# Patient Record
Sex: Female | Born: 1950 | Race: Black or African American | Hispanic: No | Marital: Married | Smoking: Current every day smoker
Health system: Southern US, Community
[De-identification: ages and names within clinical notes are randomized; demographics above are authoritative.]

## PROBLEM LIST (undated history)

## (undated) DIAGNOSIS — J449 Chronic obstructive pulmonary disease, unspecified: Secondary | ICD-10-CM

## (undated) DIAGNOSIS — I10 Essential (primary) hypertension: Secondary | ICD-10-CM

## (undated) HISTORY — PX: APPENDECTOMY: SHX54

## (undated) HISTORY — PX: EXPLORATORY LAPAROTOMY: SUR591

---

## 2010-07-13 ENCOUNTER — Emergency Department (HOSPITAL_COMMUNITY): Admission: EM | Admit: 2010-07-13 | Discharge: 2010-07-13 | Payer: Self-pay | Admitting: Emergency Medicine

## 2011-01-16 LAB — POCT CARDIAC MARKERS
CKMB, poc: <1 ng/mL — ABNORMAL LOW (ref 1.0–8.0)
Myoglobin, poc: 96.1 ng/mL (ref 12–200)
Troponin i, poc: <0.05 ng/mL (ref 0.00–0.09)

## 2011-01-16 LAB — CBC
HCT: 40.8 % (ref 36.0–46.0)
Hemoglobin: 13.5 g/dL (ref 12.0–15.0)
MCHC: 33 g/dL (ref 30.0–36.0)
RBC: 4.62 MIL/uL (ref 3.87–5.11)
WBC: 11.4 10*3/uL — ABNORMAL HIGH (ref 4.0–10.5)

## 2011-01-16 LAB — DIFFERENTIAL
Basophils Absolute: 0 10*3/uL (ref 0.0–0.1)
Basophils Relative: 0 % (ref 0–1)
Lymphocytes Relative: 12 % (ref 12–46)
Monocytes Absolute: 0.5 10*3/uL (ref 0.1–1.0)
Monocytes Relative: 5 % (ref 3–12)
Neutro Abs: 9.3 10*3/uL — ABNORMAL HIGH (ref 1.7–7.7)
Neutrophils Relative %: 82 % — ABNORMAL HIGH (ref 43–77)

## 2011-01-16 LAB — BASIC METABOLIC PANEL
Calcium: 8.7 mg/dL (ref 8.4–10.5)
GFR calc Af Amer: >60 mL/min (ref >60)
GFR calc non Af Amer: >60 mL/min (ref >60)
Potassium: 3.6 mEq/L (ref 3.5–5.1)
Sodium: 137 mEq/L (ref 135–145)

## 2022-03-02 ENCOUNTER — Other Ambulatory Visit: Payer: Self-pay

## 2022-03-02 ENCOUNTER — Encounter (HOSPITAL_COMMUNITY): Payer: Self-pay | Admitting: Emergency Medicine

## 2022-03-02 ENCOUNTER — Emergency Department (HOSPITAL_COMMUNITY): Payer: 59

## 2022-03-02 ENCOUNTER — Observation Stay (HOSPITAL_COMMUNITY)
Admission: EM | Admit: 2022-03-02 | Discharge: 2022-03-04 | Disposition: A | Discharge status: Home / Self Care | Payer: 59 | Attending: Internal Medicine | Admitting: Internal Medicine

## 2022-03-02 DIAGNOSIS — K838 Other specified diseases of biliary tract: Secondary | ICD-10-CM | POA: Insufficient documentation

## 2022-03-02 DIAGNOSIS — K8050 Calculus of bile duct without cholangitis or cholecystitis without obstruction: Secondary | ICD-10-CM

## 2022-03-02 DIAGNOSIS — F17210 Nicotine dependence, cigarettes, uncomplicated: Secondary | ICD-10-CM | POA: Diagnosis not present

## 2022-03-02 DIAGNOSIS — K8020 Calculus of gallbladder without cholecystitis without obstruction: Secondary | ICD-10-CM | POA: Diagnosis not present

## 2022-03-02 DIAGNOSIS — Z79899 Other long term (current) drug therapy: Secondary | ICD-10-CM | POA: Insufficient documentation

## 2022-03-02 DIAGNOSIS — J449 Chronic obstructive pulmonary disease, unspecified: Secondary | ICD-10-CM | POA: Diagnosis not present

## 2022-03-02 DIAGNOSIS — Z7982 Long term (current) use of aspirin: Secondary | ICD-10-CM | POA: Insufficient documentation

## 2022-03-02 DIAGNOSIS — K219 Gastro-esophageal reflux disease without esophagitis: Secondary | ICD-10-CM

## 2022-03-02 DIAGNOSIS — I10 Essential (primary) hypertension: Secondary | ICD-10-CM | POA: Insufficient documentation

## 2022-03-02 DIAGNOSIS — K8010 Calculus of gallbladder with chronic cholecystitis without obstruction: Secondary | ICD-10-CM | POA: Diagnosis not present

## 2022-03-02 DIAGNOSIS — R1011 Right upper quadrant pain: Secondary | ICD-10-CM | POA: Diagnosis present

## 2022-03-02 DIAGNOSIS — R7401 Elevation of levels of liver transaminase levels: Secondary | ICD-10-CM | POA: Insufficient documentation

## 2022-03-02 HISTORY — DX: Chronic obstructive pulmonary disease, unspecified: J44.9

## 2022-03-02 HISTORY — DX: Essential (primary) hypertension: I10

## 2022-03-02 LAB — COMPREHENSIVE METABOLIC PANEL
ALT: 204 U/L — ABNORMAL HIGH (ref 0–44)
ALT: 150 U/L — ABNORMAL HIGH (ref 0–44)
AST: 404 U/L — ABNORMAL HIGH (ref 15–41)
AST: 211 U/L — ABNORMAL HIGH (ref 15–41)
Albumin: 5 g/dL (ref 3.5–5.0)
Albumin: 3.8 g/dL (ref 3.5–5.0)
Alkaline Phosphatase: 170 U/L — ABNORMAL HIGH (ref 38–126)
Alkaline Phosphatase: 130 U/L — ABNORMAL HIGH (ref 38–126)
Anion gap: 9 (ref 5–15)
Anion gap: 3 — ABNORMAL LOW (ref 5–15)
BUN: 13 mg/dL (ref 8–23)
BUN: 11 mg/dL (ref 8–23)
CO2: 26 mmol/L (ref 22–32)
CO2: 29 mmol/L (ref 22–32)
Calcium: 9.2 mg/dL (ref 8.9–10.3)
Calcium: 8.5 mg/dL — ABNORMAL LOW (ref 8.9–10.3)
Chloride: 99 mmol/L (ref 98–111)
Chloride: 108 mmol/L (ref 98–111)
Creatinine, Ser: 0.75 mg/dL (ref 0.44–1.00)
Creatinine, Ser: 0.77 mg/dL (ref 0.44–1.00)
GFR, Estimated: >60 mL/min (ref >60)
GFR, Estimated: >60 mL/min (ref >60)
Glucose, Bld: 117 mg/dL — ABNORMAL HIGH (ref 70–99)
Glucose, Bld: 111 mg/dL — ABNORMAL HIGH (ref 70–99)
Potassium: 3.5 mmol/L (ref 3.5–5.1)
Potassium: 4.4 mmol/L (ref 3.5–5.1)
Sodium: 134 mmol/L — ABNORMAL LOW (ref 135–145)
Sodium: 140 mmol/L (ref 135–145)
Total Bilirubin: 0.9 mg/dL (ref 0.3–1.2)
Total Bilirubin: 0.6 mg/dL (ref 0.3–1.2)
Total Protein: 8.9 g/dL — ABNORMAL HIGH (ref 6.5–8.1)
Total Protein: 6.7 g/dL (ref 6.5–8.1)

## 2022-03-02 LAB — CBC WITH DIFFERENTIAL/PLATELET
Abs Immature Granulocytes: 0.01 10*3/uL (ref 0.00–0.07)
Abs Immature Granulocytes: 0 10*3/uL (ref 0.00–0.07)
Basophils Absolute: 0.1 10*3/uL (ref 0.0–0.1)
Basophils Absolute: 0.1 10*3/uL (ref 0.0–0.1)
Basophils Relative: 1 %
Basophils Relative: 1 %
Eosinophils Absolute: 0.1 10*3/uL (ref 0.0–0.5)
Eosinophils Absolute: 0.1 10*3/uL (ref 0.0–0.5)
Eosinophils Relative: 1 %
Eosinophils Relative: 1 %
HCT: 45.8 % (ref 36.0–46.0)
HCT: 40 % (ref 36.0–46.0)
Hemoglobin: 14.9 g/dL (ref 12.0–15.0)
Hemoglobin: 12.3 g/dL (ref 12.0–15.0)
Immature Granulocytes: 0 %
Immature Granulocytes: 0 %
Lymphocytes Relative: 11 %
Lymphocytes Relative: 21 %
Lymphs Abs: 0.8 10*3/uL (ref 0.7–4.0)
Lymphs Abs: 1 10*3/uL (ref 0.7–4.0)
MCH: 28.2 pg (ref 26.0–34.0)
MCH: 27.2 pg (ref 26.0–34.0)
MCHC: 32.5 g/dL (ref 30.0–36.0)
MCHC: 30.8 g/dL (ref 30.0–36.0)
MCV: 86.7 fL (ref 80.0–100.0)
MCV: 88.3 fL (ref 80.0–100.0)
Monocytes Absolute: 0.5 10*3/uL (ref 0.1–1.0)
Monocytes Absolute: 0.4 10*3/uL (ref 0.1–1.0)
Monocytes Relative: 7 %
Monocytes Relative: 9 %
Neutro Abs: 5.9 10*3/uL (ref 1.7–7.7)
Neutro Abs: 3.3 10*3/uL (ref 1.7–7.7)
Neutrophils Relative %: 80 %
Neutrophils Relative %: 68 %
Platelets: 232 10*3/uL (ref 150–400)
Platelets: 209 10*3/uL (ref 150–400)
RBC: 5.28 MIL/uL — ABNORMAL HIGH (ref 3.87–5.11)
RBC: 4.53 MIL/uL (ref 3.87–5.11)
RDW: 14 % (ref 11.5–15.5)
RDW: 14 % (ref 11.5–15.5)
WBC: 7.4 10*3/uL (ref 4.0–10.5)
WBC: 4.8 10*3/uL (ref 4.0–10.5)
nRBC: 0 % (ref 0.0–0.2)
nRBC: 0 % (ref 0.0–0.2)

## 2022-03-02 LAB — HEPATIC FUNCTION PANEL
ALT: 117 U/L — ABNORMAL HIGH (ref 0–44)
AST: 92 U/L — ABNORMAL HIGH (ref 15–41)
Albumin: 3.9 g/dL (ref 3.5–5.0)
Alkaline Phosphatase: 123 U/L (ref 38–126)
Bilirubin, Direct: 0.2 mg/dL (ref 0.0–0.2)
Indirect Bilirubin: 0.3 mg/dL (ref 0.3–0.9)
Total Bilirubin: 0.5 mg/dL (ref 0.3–1.2)
Total Protein: 6.7 g/dL (ref 6.5–8.1)

## 2022-03-02 LAB — MAGNESIUM: Magnesium: 2.1 mg/dL (ref 1.7–2.4)

## 2022-03-02 LAB — LIPASE, BLOOD: Lipase: 32 U/L (ref 11–51)

## 2022-03-02 MED ORDER — ACETAMINOPHEN 650 MG RE SUPP: 650.0000 mg | Freq: Four times a day (QID) | RECTAL | Status: DC | PRN

## 2022-03-02 MED ORDER — AMLODIPINE BESYLATE 5 MG PO TABS
5.0000 mg | ORAL_TABLET | Freq: Every day | ORAL | Status: DC
  Administered 2022-03-02 – 2022-03-04 (×2): 5 mg via ORAL
  Filled 2022-03-02 (×3): qty 1

## 2022-03-02 MED ORDER — PIPERACILLIN-TAZOBACTAM 3.375 G IVPB
3.3750 g | Freq: Three times a day (TID) | INTRAVENOUS | Status: DC
  Administered 2022-03-02 – 2022-03-03 (×4): 3.375 g via INTRAVENOUS
  Filled 2022-03-02 (×4): qty 50

## 2022-03-02 MED ORDER — OXYCODONE HCL 5 MG PO TABS
5.0000 mg | ORAL_TABLET | ORAL | Status: DC | PRN
  Administered 2022-03-03 – 2022-03-04 (×3): 5 mg via ORAL
  Filled 2022-03-02 (×3): qty 1

## 2022-03-02 MED ORDER — SODIUM CHLORIDE 0.9 % IV SOLN: INTRAVENOUS | Status: DC

## 2022-03-02 MED ORDER — MORPHINE SULFATE (PF) 2 MG/ML IV SOLN: 2.0000 mg | INTRAVENOUS | Status: DC | PRN

## 2022-03-02 MED ORDER — ONDANSETRON HCL 4 MG PO TABS: 4.0000 mg | ORAL_TABLET | Freq: Four times a day (QID) | ORAL | Status: DC | PRN

## 2022-03-02 MED ORDER — MORPHINE SULFATE (PF) 4 MG/ML IV SOLN
4.0000 mg | Freq: Once | INTRAVENOUS | Status: AC
  Administered 2022-03-02: 4 mg via INTRAVENOUS
  Filled 2022-03-02: qty 1

## 2022-03-02 MED ORDER — SODIUM CHLORIDE 0.9 % IV BOLUS
1000.0000 mL | Freq: Once | INTRAVENOUS | Status: AC
  Administered 2022-03-02: 1000 mL via INTRAVENOUS

## 2022-03-02 MED ORDER — ONDANSETRON HCL 4 MG/2ML IJ SOLN
4.0000 mg | Freq: Four times a day (QID) | INTRAMUSCULAR | Status: DC | PRN
  Administered 2022-03-03: 4 mg via INTRAVENOUS
  Filled 2022-03-02: qty 2

## 2022-03-02 MED ORDER — ALBUTEROL SULFATE HFA 108 (90 BASE) MCG/ACT IN AERS
1.0000 | INHALATION_SPRAY | RESPIRATORY_TRACT | Status: DC | PRN
  Administered 2022-03-02 – 2022-03-03 (×2): 1 via RESPIRATORY_TRACT
  Filled 2022-03-02: qty 6.7

## 2022-03-02 MED ORDER — ONDANSETRON HCL 4 MG/2ML IJ SOLN
4.0000 mg | Freq: Once | INTRAMUSCULAR | Status: AC
  Administered 2022-03-02: 4 mg via INTRAVENOUS
  Filled 2022-03-02: qty 2

## 2022-03-02 MED ORDER — HEPARIN SODIUM (PORCINE) 5000 UNIT/ML IJ SOLN
5000.0000 [IU] | Freq: Three times a day (TID) | INTRAMUSCULAR | Status: DC
  Administered 2022-03-02 – 2022-03-04 (×6): 5000 [IU] via SUBCUTANEOUS
  Filled 2022-03-02 (×6): qty 1

## 2022-03-02 MED ORDER — ACETAMINOPHEN 325 MG PO TABS: 650.0000 mg | ORAL_TABLET | Freq: Four times a day (QID) | ORAL | Status: DC | PRN

## 2022-03-02 MED ORDER — IOHEXOL 300 MG/ML  SOLN
100.0000 mL | Freq: Once | INTRAMUSCULAR | Status: AC | PRN
  Administered 2022-03-02: 100 mL via INTRAVENOUS

## 2022-03-02 NOTE — H&P (View-Only) (Signed)
[facility] Consult ? ?Reason for Consult:Symptomatic cholelithiasis and elevated liver test, dilated CBD  ?Referring Physician: Dr. Madera, Dr. Rourk  ? ?Chief Complaint   ?Abdominal Pain ?  ? ? ?HPI: Gabriella Pollard is a 71 y.o. female with a history of gallstones who lives in [place] and is scheduled to get her gallbladder out 6/28. She is here visiting and has had RUQ pain and associated nausea and vomiting for the last 6 weeks with that worsening yesterday while on vacation. She had elevated LFT and dilated CBD with known stones. LFT trending down.  ? ?She is feeling better today.  ? ?Past Medical History:  ?Diagnosis Date  ? COPD (chronic obstructive pulmonary disease) (HCC)   ? Hypertension   ? ? ?Past Surgical History:  ?Procedure Laterality Date  ? APPENDECTOMY    ? EXPLORATORY LAPAROTOMY    ? 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 ? ?History reviewed. No pertinent family history. ? ?Social History  ? ?Tobacco Use  ? Smoking status: Every Day  ?  Types: Cigarettes  ? Smokeless tobacco: Never  ?Vaping Use  ? Vaping Use: Never used  ?Substance Use Topics  ? Alcohol use: Not Currently  ? Drug use: Never  ? ? ?Medications: I have reviewed the patient's current medications. ?Prior to Admission:  ?Medications Prior to Admission  ?Medication Sig Dispense Refill Last Dose  ? acetaminophen (TYLENOL) 500 MG tablet Take 500 mg by mouth every 6 (six) hours as needed for moderate pain.   Past Week  ? albuterol (VENTOLIN HFA) 108 (90 Base) MCG/ACT inhaler Inhale 1 puff into the lungs every 6 (six) hours as needed for wheezing or shortness of breath.   Past Week  ? amLODipine (NORVASC) 10 MG tablet Take 10 mg by mouth daily.   03/01/2022  ? aspirin EC 81 MG tablet Take 81 mg by mouth daily. Swallow whole.   03/01/2022  ? fluconazole (DIFLUCAN) 150 MG tablet Take 150 mg by mouth once a week.   Past Week  ? hydrochlorothiazide (HYDRODIURIL) 12.5 MG tablet Take 12.5  mg by mouth daily.   03/01/2022  ? nitrofurantoin, macrocrystal-monohydrate, (MACROBID) 100 MG capsule Take 100 mg by mouth every 12 (twelve) hours.   03/01/2022  ? pantoprazole (PROTONIX) 40 MG tablet Take 40 mg by mouth every morning.   03/01/2022  ? Vitamin D, Ergocalciferol, (DRISDOL) 1.25 MG (50000 UNIT) CAPS capsule Take 50,000 Units by mouth once a week.   Past Week  ? ?Scheduled: ? amLODipine  5 mg Oral Daily  ? heparin  5,000 Units Subcutaneous Q8H  ? ?Continuous: ? sodium chloride 75 mL/hr at 03/02/22 0424  ? piperacillin-tazobactam 3.375 g (03/02/22 1147)  ? ?PRN:acetaminophen **OR** acetaminophen, albuterol, morphine injection, ondansetron **OR** ondansetron (ZOFRAN) IV, oxyCODONE ? ?No Known Allergies ? ? ?ROS:  ?A comprehensive review of systems was negative except for: Gastrointestinal: positive for abdominal pain, nausea, and vomiting ? ?Blood pressure 108/63, pulse 75, temperature 98.4 ?F (36.9 ?C), temperature source Oral, resp. rate 13, height 5' 1" (1.549 m), weight 69.4 kg, SpO2 93 %. ?Physical Exam ?Vitals reviewed.  ?Constitutional:   ?   Appearance: She is well-developed.  ?HENT:  ?   Head: Normocephalic.  ?Eyes:  ?   Extraocular Movements: Extraocular movements intact.  ?Cardiovascular:  ?   Rate and Rhythm: Regular rhythm.  ?Pulmonary:  ?   Effort: Pulmonary effort is normal.  ?Abdominal:  ?   Palpations:   Abdomen is soft.  ?   Tenderness: There is abdominal tenderness in the right upper quadrant and epigastric area.  ?Skin: ?   General: Skin is warm.  ?Neurological:  ?   General: No focal deficit present.  ?   Mental Status: She is alert and oriented to person, place, and time.  ?Psychiatric:     ?   Mood and Affect: Mood normal.     ?   Behavior: Behavior normal.  ? ? ?Results: ?Results for orders placed or performed during the hospital encounter of 03/02/22 (from the past 48 hour(s))  ?Comprehensive metabolic panel     Status: Abnormal  ? Collection Time: 03/02/22  2:14 AM  ?Result Value Ref  Range  ? Sodium 134 (L) 135 - 145 mmol/L  ? Potassium 3.5 3.5 - 5.1 mmol/L  ? Chloride 99 98 - 111 mmol/L  ? CO2 26 22 - 32 mmol/L  ? Glucose, Bld 117 (H) 70 - 99 mg/dL  ?  Comment: Glucose reference range applies only to samples taken after fasting for at least 8 hours.  ? BUN 13 8 - 23 mg/dL  ? Creatinine, Ser 0.75 0.44 - 1.00 mg/dL  ? Calcium 9.2 8.9 - 10.3 mg/dL  ? Total Protein 8.9 (H) 6.5 - 8.1 g/dL  ? Albumin 5.0 3.5 - 5.0 g/dL  ? AST 404 (H) 15 - 41 U/L  ? ALT 204 (H) 0 - 44 U/L  ? Alkaline Phosphatase 170 (H) 38 - 126 U/L  ? Total Bilirubin 0.9 0.3 - 1.2 mg/dL  ? GFR, Estimated >60 >60 mL/min  ?  Comment: (NOTE) ?Calculated using the CKD-EPI Creatinine Equation (2021) ?  ? Anion gap 9 5 - 15  ?  Comment: Performed at [facility], [address] [place], [place] [address]  ?Lipase, blood     Status: None  ? Collection Time: 03/02/22  2:14 AM  ?Result Value Ref Range  ? Lipase 32 11 - 51 U/L  ?  Comment: Performed at [facility], [address], [place], [place] [address]  ?CBC with Differential     Status: Abnormal  ? Collection Time: 03/02/22  2:14 AM  ?Result Value Ref Range  ? WBC 7.4 4.0 - 10.5 K/uL  ? RBC 5.28 (H) 3.87 - 5.11 MIL/uL  ? Hemoglobin 14.9 12.0 - 15.0 g/dL  ? HCT 45.8 36.0 - 46.0 %  ? MCV 86.7 80.0 - 100.0 fL  ? MCH 28.2 26.0 - 34.0 pg  ? MCHC 32.5 30.0 - 36.0 g/dL  ? RDW 14.0 11.5 - 15.5 %  ? Platelets 232 150 - 400 K/uL  ? nRBC 0.0 0.0 - 0.2 %  ? Neutrophils Relative % 80 %  ? Neutro Abs 5.9 1.7 - 7.7 K/uL  ? Lymphocytes Relative 11 %  ? Lymphs Abs 0.8 0.7 - 4.0 K/uL  ? Monocytes Relative 7 %  ? Monocytes Absolute 0.5 0.1 - 1.0 K/uL  ? Eosinophils Relative 1 %  ? Eosinophils Absolute 0.1 0.0 - 0.5 K/uL  ? Basophils Relative 1 %  ? Basophils Absolute 0.1 0.0 - 0.1 K/uL  ? Immature Granulocytes 0 %  ? Abs Immature Granulocytes 0.01 0.00 - 0.07 K/uL  ?  Comment: Performed at [facility], [address] [place], [place] [address]  ?Comprehensive metabolic panel     Status: Abnormal  ?  Collection Time: 03/02/22  5:57 AM  ?Result Value Ref Range  ? Sodium 140 135 - 145 mmol/L  ?  Potassium 4.4 3.5 - 5.1 mmol/L  ?  Comment: DELTA CHECK NOTED  ? Chloride 108 98 - 111 mmol/L  ? CO2 29 22 - 32 mmol/L  ? Glucose, Bld 111 (H) 70 - 99 mg/dL  ?  Comment: Glucose reference range applies only to samples taken after fasting for at least 8 hours.  ? BUN 11 8 - 23 mg/dL  ? Creatinine, Ser 0.77 0.44 - 1.00 mg/dL  ? Calcium 8.5 (L) 8.9 - 10.3 mg/dL  ? Total Protein 6.7 6.5 - 8.1 g/dL  ? Albumin 3.8 3.5 - 5.0 g/dL  ? AST 211 (H) 15 - 41 U/L  ? ALT 150 (H) 0 - 44 U/L  ? Alkaline Phosphatase 130 (H) 38 - 126 U/L  ? Total Bilirubin 0.6 0.3 - 1.2 mg/dL  ? GFR, Estimated >60 >60 mL/min  ?  Comment: (NOTE) ?Calculated using the CKD-EPI Creatinine Equation (2021) ?  ? Anion gap 3 (L) 5 - 15  ?  Comment: Performed at [facility], [address] [place], [place] [address]  ?Magnesium     Status: None  ? Collection Time: 03/02/22  5:57 AM  ?Result Value Ref Range  ? Magnesium 2.1 1.7 - 2.4 mg/dL  ?  Comment: Performed at [facility], [address], [place], [place] [address]  ?CBC with Differential/Platelet     Status: None  ? Collection Time: 03/02/22  5:57 AM  ?Result Value Ref Range  ? WBC 4.8 4.0 - 10.5 K/uL  ? RBC 4.53 3.87 - 5.11 MIL/uL  ? Hemoglobin 12.3 12.0 - 15.0 g/dL  ? HCT 40.0 36.0 - 46.0 %  ? MCV 88.3 80.0 - 100.0 fL  ? MCH 27.2 26.0 - 34.0 pg  ? MCHC 30.8 30.0 - 36.0 g/dL  ? RDW 14.0 11.5 - 15.5 %  ? Platelets 209 150 - 400 K/uL  ? nRBC 0.0 0.0 - 0.2 %  ? Neutrophils Relative % 68 %  ? Neutro Abs 3.3 1.7 - 7.7 K/uL  ? Lymphocytes Relative 21 %  ? Lymphs Abs 1.0 0.7 - 4.0 K/uL  ? Monocytes Relative 9 %  ? Monocytes Absolute 0.4 0.1 - 1.0 K/uL  ? Eosinophils Relative 1 %  ? Eosinophils Absolute 0.1 0.0 - 0.5 K/uL  ? Basophils Relative 1 %  ? Basophils Absolute 0.1 0.0 - 0.1 K/uL  ? Immature Granulocytes 0 %  ? Abs Immature Granulocytes 0.00 0.00 - 0.07 K/uL   ?  Comment: Performed at [facility], [address] [place], [place] [address]  ? ?Personally reviewed- stones, dilated gallbladder and duct  ?CT ABDOMEN PELVIS W CONTRAST ? ?Result Date: 03/02/2022 ?CLI

## 2022-03-02 NOTE — Assessment & Plan Note (Addendum)
-  AST 404, 86.  204 at time of admission. ?-Appears to be associated with passing a gallstone; patient with cholelithiasis. ?-CT scan demonstrating dilated CBD but no frank stone obstruction. ?-With fluid resuscitation and supportive care LFTs has trended down tremendously and at discharge AST 50 and ALT 86. ?-There was no signs of cholecystitis. ?-Case discussed with general surgery patient is status post laparoscopic cholecystectomy. ?-Tolerating diet appropriately and expressing just mild intermittent nausea and some expected postoperative discomfort. ?-Cholangiogram including laparoscopic cholecystectomy demonstrated no presence of retained stone or obstruction. ?

## 2022-03-02 NOTE — Plan of Care (Signed)

## 2022-03-02 NOTE — ED Notes (Signed)
Pt returned from CT Scan 

## 2022-03-02 NOTE — Assessment & Plan Note (Addendum)
Stable. -Resume Norvasc, carvedilol, Lasix, lisinopril 40 daily, 

## 2022-03-02 NOTE — Assessment & Plan Note (Addendum)
-  Empirically started on antibiotics; no signs of cholecystitis. ?-Patient is afebrile and WBCs within normal limit ?-Antibiotic has been discontinued ?-Patient remains afebrile and LFTs improved and close to normal time of discharge. ?-Appreciate recommendations by GI and general surgery service.Marland Kitchen ?

## 2022-03-02 NOTE — ED Provider Notes (Signed)
?[facility] EMERGENCY DEPARTMENT ?Provider Note ? ? ?CSN: [id number] ?Arrival date & time: 03/02/22  0027 ? ?  ? ?History ? ?Chief Complaint  ?Patient presents with  ? Abdominal Pain  ? ? ?Gabriella Pollard is a 71 y.o. female. ? ?Patient is a 71 year old female with history of prior appendectomy, abdominal tumor removal, and cholelithiasis awaiting surgery in June.  Patient is here visiting from [place], where the work-up into her gallbladder has been performed.  Patient was told by her primary doctor if her pain worsen, she should go to the ER.  She is now experiencing worsening epigastric and right upper quadrant pain that is making it difficult for her to eat or drink.  She denies any fevers or chills.  She denies any bowel or bladder complaints. ? ?The history is provided by the patient.  ?Abdominal Pain ?Pain location:  Epigastric and RUQ ?Pain quality: cramping   ?Pain radiates to:  Does not radiate ?Pain severity:  Moderate ?Onset quality:  Gradual ?Duration:  24 hours ?Timing:  Constant ?Progression:  Worsening ?Chronicity:  Recurrent ?Relieved by:  Nothing ?Worsened by:  Eating, movement and palpation ? ?  ? ?Home Medications ?Prior to Admission medications   ?Not on File  ?   ? ?Allergies    ?Patient has no known allergies.   ? ?Review of Systems   ?Review of Systems  ?Gastrointestinal:  Positive for abdominal pain.  ?All other systems reviewed and are negative. ? ?Physical Exam ?Updated Vital Signs ?BP (!) 152/80 (BP Location: Right Arm)   Pulse 90   Temp (!) 97.5 ?F (36.4 ?C) (Oral)   Resp 18   Ht 5' 1.5" (1.562 m)   Wt 67.6 kg   SpO2 98%   BMI 27.70 kg/m?  ?Physical Exam ?Vitals and nursing note reviewed.  ?Constitutional:   ?   General: She is not in acute distress. ?   Appearance: She is well-developed. She is not diaphoretic.  ?HENT:  ?   Head: Normocephalic and atraumatic.  ?Cardiovascular:  ?   Rate and Rhythm: Normal rate and regular rhythm.  ?   Heart sounds: No murmur heard. ?  No friction  rub. No gallop.  ?Pulmonary:  ?   Effort: Pulmonary effort is normal. No respiratory distress.  ?   Breath sounds: Normal breath sounds. No wheezing.  ?Abdominal:  ?   General: Bowel sounds are normal. There is no distension.  ?   Palpations: Abdomen is soft.  ?   Tenderness: There is abdominal tenderness in the right upper quadrant and epigastric area. There is no right CVA tenderness, left CVA tenderness, guarding or rebound.  ?Musculoskeletal:     ?   General: Normal range of motion.  ?   Cervical back: Normal range of motion and neck supple.  ?Skin: ?   General: Skin is warm and dry.  ?Neurological:  ?   General: No focal deficit present.  ?   Mental Status: She is alert and oriented to person, place, and time.  ? ? ?ED Results / Procedures / Treatments   ?Labs ?(all labs ordered are listed, but only abnormal results are displayed) ?Labs Reviewed  ?COMPREHENSIVE METABOLIC PANEL  ?LIPASE, BLOOD  ?CBC WITH DIFFERENTIAL/PLATELET  ? ? ?EKG ?None ? ?Radiology ?No results found. ? ?Procedures ?Procedures  ? ? ?Medications Ordered in ED ?Medications  ?sodium chloride 0.9 % bolus 1,000 mL (has no administration in time range)  ?ondansetron (ZOFRAN) injection 4 mg (has no administration in time  range)  ?morphine (PF) 4 MG/ML injection 4 mg (has no administration in time range)  ? ? ?ED Course/ Medical Decision Making/ A&P ? ?This patient presents to the ED for concern of abdominal pain, this involves an extensive number of treatment options, and is a complaint that carries with it a high risk of complications and morbidity.  The differential diagnosis includes acute cholecystitis, pancreatitis, small bowel obstruction, etc. ? ? ?Co morbidities that complicate the patient evaluation ? ?None ? ? ?Additional history obtained: ? ?No additional history or external records are accessible. ? ? ?Lab Tests: ? ?I Ordered, and personally interpreted labs.  The pertinent results include: Elevation of her LFTs, but no leukocytosis or  other abnormality ? ? ?Imaging Studies ordered: ? ?I ordered imaging studies including CT scan of the abdomen and pelvis ?I independently visualized and interpreted imaging which showed a distended gallbladder with dilatation of the common bile duct and intrahepatic ducts, but no obvious stone ?I agree with the radiologist interpretation ? ? ?Cardiac Monitoring: / EKG: ? ?The patient was maintained on a cardiac monitor.  I personally viewed and interpreted the cardiac monitored which showed an underlying rhythm of: Sinus ? ? ?Consultations Obtained: ? ?I requested consultation with the hospitalist,  and discussed lab and imaging findings as well as pertinent plan - they recommend: Admission for GI consultation and further evaluation ? ? ?Problem List / ED Course / Critical interventions / Medication management ? ?Patient presenting with epigastric/right upper quadrant pain.  She has a history of known gallstones diagnosed in her home state of [place].  She is here visiting.  Patient's work-up shows elevations of her liver functions along with distended gallbladder and dilated biliary ducts.  I feel as though patient will require admission for pain control and GI consultation. ?I ordered medication including morphine and Zofran for pain and nausea ?Reevaluation of the patient after these medicines showed that the patient improved ?I have reviewed the patients home medicines and have made adjustments as needed ? ? ?Social Determinants of Health: ? ?None ? ? ?Test / Admission - Considered: ? ?Patient to be admitted to the hospitalist service for further care. ? ? ?Final Clinical Impression(s) / ED Diagnoses ?Final diagnoses:  ?None  ? ? ?Rx / DC Orders ?ED Discharge Orders   ? ? None  ? ?  ? ? ?  ?Geoffery Lyons, MD ?03/02/22 [phone] ? ?

## 2022-03-02 NOTE — Progress Notes (Signed)
LFT ?Recent Labs  ?  03/02/22 ?1727  ?PROT 6.7  ?ALBUMIN 3.9  ?AST 92*  ?ALT 117*  ?ALKPHOS 123  ?BILITOT 0.5  ?BILIDIR 0.2  ?IBILI 0.3  ? ?LFT's normalizing nicely; agree with plans for cholecystectomy ? ?Gerrit Friends. Obdulia Steier, MD ?

## 2022-03-02 NOTE — Assessment & Plan Note (Addendum)
-  Continue as needed albuterol inhaler ?-Continue to monitor ?

## 2022-03-02 NOTE — H&P (Signed)
?History and Physical  ? ? ?Patient: Gabriella Pollard VQQ:[id number] DOB: 27-Oct-1951 ?DOA: 03/02/2022 ?DOS: the patient was seen and examined on 03/02/2022 ?PCP: Pcp, No  ?Patient coming from: Home ? ?Chief Complaint:  ?Chief Complaint  ?Patient presents with  ? Abdominal Pain  ? ?HPI: Gabriella Pollard is a 71 y.o. female with medical history significant of COPD, hypertension, hyperlipidemia, known cholelithiasis, presents to the ED with a chief complaint of right upper quadrant pain.  Patient reports she knows when her gallbladder is in a flare because she feels near syncopal.  She was feeling that way and then she had a burning pain across her upper right and left quadrant of her abdomen.  The pain was intense.  It started around 8 PM.  She had nausea with 3 episodes of vomiting.  She denies any hematemesis.  The pain does not radiate up into her chest or down to her groin.  Her last normal meal was at 9 AM.  She reports a decreased appetite because she is afraid to eat she does not know what will hurt her gallbladder.  Her sister is trying to help her with a low-fat diet.  Patient is lost 12-14 pounds.  Her last normal bowel movement was the morning of presentation.  Patient has had no diarrhea but she does have constipation and reports that she has to strain to move her bowels.  She is not taking anything for that at home.  Patient also reports generalized weakness which she attributes to decreased p.o. intake.  Lastly she reports dark urine in the a.m. that clears throughout the day.  She denies fevers, cough, dyspnea.  Patient has no other complaints at this time. ? ?Of note patient is visiting from out of town.  She lives in [place].  She has known cholelithiasis and is scheduled for cholecystectomy on June 28. ? ?Patient is trying to quit smoking and she is down to 2-3 cigarettes/day.  She does not drink, she does not use illicit drugs.  She is vaccinated for COVID.  Patient is full code. ?Review of Systems: As mentioned  in the history of present illness. All other systems reviewed and are negative. ?Past Medical History:  ?Diagnosis Date  ? COPD (chronic obstructive pulmonary disease) (HCC)   ? Hypertension   ? ?Past Surgical History:  ?Procedure Laterality Date  ? APPENDECTOMY    ? ?Social History:  reports that she has been smoking cigarettes. She has never used smokeless tobacco. She reports that she does not currently use alcohol. She reports that she does not use drugs. ? ?No Known Allergies ? ?History reviewed. No pertinent family history. ? ?Prior to Admission medications   ?Not on File  ? ? ?Physical Exam: ?Vitals:  ? 03/02/22 0108 03/02/22 0111 03/02/22 0200 03/02/22 0407  ?BP:  (!) 152/80 140/68 136/74  ?Pulse:  90 79 84  ?Resp:  18 18 18   ?Temp:  (!) 97.5 ?F (36.4 ?C)    ?TempSrc:  Oral    ?SpO2:  98% 98% 95%  ?Weight: 67.6 kg     ?Height: 1' 0.5" (0.318 m) 5' 1.5" (1.562 m)    ? ?1.  General: ?Patient lying supine in bed,  no acute distress ?  ?2. Psychiatric: ?Alert and oriented x 3, mood and behavior normal for situation, pleasant and cooperative with exam ?  ?3. Neurologic: ?Speech and language are normal, face is symmetric, moves all 4 extremities voluntarily, at baseline without acute deficits on limited exam ?  ?  4. HEENMT:  ?Head is atraumatic, normocephalic, pupils reactive to light, neck is supple, trachea is midline, mucous membranes are moist ?  ?5. Respiratory : ?Lungs are clear to auscultation bilaterally without wheezing, rhonchi, rales, no cyanosis, no increase in work of breathing or accessory muscle use ?  ?6. Cardiovascular : ?Heart rate normal, rhythm is regular, no murmurs, rubs or gallops, no peripheral edema, peripheral pulses palpated ?  ?7. Gastrointestinal:  ?Abdomen is soft, nondistended, nontender to palpation at the time of my exam status post morphine, bowel sounds active, no masses or organomegaly palpated ?  ?8. Skin:  ?Skin is warm, dry and intact without rashes, acute lesions, or ulcers  on limited exam ?  ?9.Musculoskeletal:  ?No acute deformities or trauma, no asymmetry in tone, no peripheral edema, peripheral pulses palpated, no tenderness to palpation in the extremities ? ?Data Reviewed: ?In the ED ?Temp 97.5, heart rate 79-90, respiratory rate 18, blood pressure 140/68-152/80, satting 98% ?No leukocytosis with a white blood cell count of 7.4, hemoglobin 14.9, platelets 232 ?Chemistry is unremarkable ?Lipase 32 ?Transaminitis with an AST of 404 and an ALT of 204 ?CT abdomen pelvis shows dilated gallbladder with cholelithiasis.  Bile duct dilatation with a prominence of the 11 mm.  No definitive stone.  Mild intrahepatic ductal dilatation noted as well. ?Patient was given 4 mg of morphine, 4 mg of Zofran, 1 L normal saline.  Admission was requested for further management of cholelithiasis with possible obstructing gallstone and transaminitis ? ?Assessment and Plan: ?* Transaminitis ?- AST 404, ALT 204 ?-CT abdomen pelvis shows a dilated gallbladder with cholelithiasis.  Bile duct prominence at 11 mm.  No definitive stone.  Mild intrahepatic ductal dilatation noted ?-Concern for obstructing gallstone ?-Consult GI for possible ERCP ?-Continue IV fluids ?-Trend in the a.m. ?-Patient reports taking medication for hyperlipidemia, although she is not sure what it is.  No statins will be started given transaminitis. ?-Continue to monitor ? ?COPD (chronic obstructive pulmonary disease) (HCC) ?- Continue as needed albuterol inhaler ?-Continue to monitor ? ?Common bile duct dilatation ?- With known cholelithiasis, no transaminitis ?-Concern for bile duct obstruction ?-Consult GI for possible ERCP ?-Continue morphine for pain ?-Start Zosyn ?-Continue to monitor ? ? ? ? ? Advance Care Planning:   Code Status: Full Code  ? ?Consults: GI ? ?Family Communication: Sister at bedside ? ?Severity of Illness: ?The appropriate patient status for this patient is OBSERVATION. Observation status is judged to be  reasonable and necessary in order to provide the required intensity of service to ensure the patient's safety. The patient's presenting symptoms, physical exam findings, and initial radiographic and laboratory data in the context of their medical condition is felt to place them at decreased risk for further clinical deterioration. Furthermore, it is anticipated that the patient will be medically stable for discharge from the hospital within 2 midnights of admission.  ? ?Author: ?Lilyan Gilford, DO ?03/02/2022 4:52 AM ? ?For on call review [email or web address].  ?

## 2022-03-02 NOTE — ED Notes (Signed)
Patient transported to CT 

## 2022-03-02 NOTE — ED Notes (Signed)
ED Provider at bedside. 

## 2022-03-02 NOTE — ED Triage Notes (Addendum)
Pt with c/o abdominal pain. Pt scheduled for Gallbladder removal on June 28th in [place] where she resides. Pts MD had advised pt to seek care at ED if she had a "flare-up" while on vacation. Pt also currently on ABX for UTI. ?

## 2022-03-02 NOTE — Progress Notes (Signed)
Patient as 4 rings ?

## 2022-03-02 NOTE — Consult Note (Signed)
[facility] Consult ? ?Reason for Consult:Symptomatic cholelithiasis and elevated liver test, dilated CBD  ?Referring Physician: Dr. Gwenlyn PerkingMadera, Dr. Jena Gaussourk  ? ?Chief Complaint   ?Abdominal Pain ?  ? ? ?HPI: Willaim RayasMary Pollard is a 71 y.o. female with a history of gallstones who lives in [place] and is scheduled to get her gallbladder out 6/28. She is here visiting and has had RUQ pain and associated nausea and vomiting for the last 6 weeks with that worsening yesterday while on vacation. She had elevated LFT and dilated CBD with known stones. LFT trending down.  ? ?She is feeling better today.  ? ?Past Medical History:  ?Diagnosis Date  ? COPD (chronic obstructive pulmonary disease) (HCC)   ? Hypertension   ? ? ?Past Surgical History:  ?Procedure Laterality Date  ? APPENDECTOMY    ? EXPLORATORY LAPAROTOMY    ? benign tumor removed (not attached to any organ per patient); in [place] in 2002  ? ? ?History reviewed. No pertinent family history. ? ?Social History  ? ?Tobacco Use  ? Smoking status: Every Day  ?  Types: Cigarettes  ? Smokeless tobacco: Never  ?Vaping Use  ? Vaping Use: Never used  ?Substance Use Topics  ? Alcohol use: Not Currently  ? Drug use: Never  ? ? ?Medications: I have reviewed the patient's current medications. ?Prior to Admission:  ?Medications Prior to Admission  ?Medication Sig Dispense Refill Last Dose  ? acetaminophen (TYLENOL) 500 MG tablet Take 500 mg by mouth every 6 (six) hours as needed for moderate pain.   Past Week  ? albuterol (VENTOLIN HFA) 108 (90 Base) MCG/ACT inhaler Inhale 1 puff into the lungs every 6 (six) hours as needed for wheezing or shortness of breath.   Past Week  ? amLODipine (NORVASC) 10 MG tablet Take 10 mg by mouth daily.   03/01/2022  ? aspirin EC 81 MG tablet Take 81 mg by mouth daily. Swallow whole.   03/01/2022  ? fluconazole (DIFLUCAN) 150 MG tablet Take 150 mg by mouth once a week.   Past Week  ? hydrochlorothiazide (HYDRODIURIL) 12.5 MG tablet Take 12.5  mg by mouth daily.   03/01/2022  ? nitrofurantoin, macrocrystal-monohydrate, (MACROBID) 100 MG capsule Take 100 mg by mouth every 12 (twelve) hours.   03/01/2022  ? pantoprazole (PROTONIX) 40 MG tablet Take 40 mg by mouth every morning.   03/01/2022  ? Vitamin D, Ergocalciferol, (DRISDOL) 1.25 MG (50000 UNIT) CAPS capsule Take 50,000 Units by mouth once a week.   Past Week  ? ?Scheduled: ? amLODipine  5 mg Oral Daily  ? heparin  5,000 Units Subcutaneous Q8H  ? ?Continuous: ? sodium chloride 75 mL/hr at 03/02/22 0424  ? piperacillin-tazobactam 3.375 g (03/02/22 1147)  ? ?OZH:[id number]:acetaminophen **OR** acetaminophen, albuterol, morphine injection, ondansetron **OR** ondansetron (ZOFRAN) IV, oxyCODONE ? ?No Known Allergies ? ? ?ROS:  ?A comprehensive review of systems was negative except for: Gastrointestinal: positive for abdominal pain, nausea, and vomiting ? ?Blood pressure 108/63, pulse 75, temperature 98.4 ?F (36.9 ?C), temperature source Oral, resp. rate 13, height 5\' 1"  (1.549 m), weight 69.4 kg, SpO2 93 %. ?Physical Exam ?Vitals reviewed.  ?Constitutional:   ?   Appearance: She is well-developed.  ?HENT:  ?   Head: Normocephalic.  ?Eyes:  ?   Extraocular Movements: Extraocular movements intact.  ?Cardiovascular:  ?   Rate and Rhythm: Regular rhythm.  ?Pulmonary:  ?   Effort: Pulmonary effort is normal.  ?Abdominal:  ?   Palpations:  Abdomen is soft.  ?   Tenderness: There is abdominal tenderness in the right upper quadrant and epigastric area.  ?Skin: ?   General: Skin is warm.  ?Neurological:  ?   General: No focal deficit present.  ?   Mental Status: She is alert and oriented to person, place, and time.  ?Psychiatric:     ?   Mood and Affect: Mood normal.     ?   Behavior: Behavior normal.  ? ? ?Results: ?Results for orders placed or performed during the hospital encounter of 03/02/22 (from the past 48 hour(s))  ?Comprehensive metabolic panel     Status: Abnormal  ? Collection Time: 03/02/22  2:14 AM  ?Result Value Ref  Range  ? Sodium 134 (L) 135 - 145 mmol/L  ? Potassium 3.5 3.5 - 5.1 mmol/L  ? Chloride 99 98 - 111 mmol/L  ? CO2 26 22 - 32 mmol/L  ? Glucose, Bld 117 (H) 70 - 99 mg/dL  ?  Comment: Glucose reference range applies only to samples taken after fasting for at least 8 hours.  ? BUN 13 8 - 23 mg/dL  ? Creatinine, Ser 0.75 0.44 - 1.00 mg/dL  ? Calcium 9.2 8.9 - 10.3 mg/dL  ? Total Protein 8.9 (H) 6.5 - 8.1 g/dL  ? Albumin 5.0 3.5 - 5.0 g/dL  ? AST 404 (H) 15 - 41 U/L  ? ALT 204 (H) 0 - 44 U/L  ? Alkaline Phosphatase 170 (H) 38 - 126 U/L  ? Total Bilirubin 0.9 0.3 - 1.2 mg/dL  ? GFR, Estimated >60 >60 mL/min  ?  Comment: (NOTE) ?Calculated using the CKD-EPI Creatinine Equation (2021) ?  ? Anion gap 9 5 - 15  ?  Comment: Performed at [facility], [address] [place], [place] [address]  ?Lipase, blood     Status: None  ? Collection Time: 03/02/22  2:14 AM  ?Result Value Ref Range  ? Lipase 32 11 - 51 U/L  ?  Comment: Performed at [facility], [address], [place], [place] [address]  ?CBC with Differential     Status: Abnormal  ? Collection Time: 03/02/22  2:14 AM  ?Result Value Ref Range  ? WBC 7.4 4.0 - 10.5 K/uL  ? RBC 5.28 (H) 3.87 - 5.11 MIL/uL  ? Hemoglobin 14.9 12.0 - 15.0 g/dL  ? HCT 45.8 36.0 - 46.0 %  ? MCV 86.7 80.0 - 100.0 fL  ? MCH 28.2 26.0 - 34.0 pg  ? MCHC 32.5 30.0 - 36.0 g/dL  ? RDW 14.0 11.5 - 15.5 %  ? Platelets 232 150 - 400 K/uL  ? nRBC 0.0 0.0 - 0.2 %  ? Neutrophils Relative % 80 %  ? Neutro Abs 5.9 1.7 - 7.7 K/uL  ? Lymphocytes Relative 11 %  ? Lymphs Abs 0.8 0.7 - 4.0 K/uL  ? Monocytes Relative 7 %  ? Monocytes Absolute 0.5 0.1 - 1.0 K/uL  ? Eosinophils Relative 1 %  ? Eosinophils Absolute 0.1 0.0 - 0.5 K/uL  ? Basophils Relative 1 %  ? Basophils Absolute 0.1 0.0 - 0.1 K/uL  ? Immature Granulocytes 0 %  ? Abs Immature Granulocytes 0.01 0.00 - 0.07 K/uL  ?  Comment: Performed at [facility], [address] [place], [place] [address]  ?Comprehensive metabolic panel     Status: Abnormal  ?  Collection Time: 03/02/22  5:57 AM  ?Result Value Ref Range  ? Sodium 140 135 - 145 mmol/L  ?  Potassium 4.4 3.5 - 5.1 mmol/L  ?  Comment: DELTA CHECK NOTED  ? Chloride 108 98 - 111 mmol/L  ? CO2 29 22 - 32 mmol/L  ? Glucose, Bld 111 (H) 70 - 99 mg/dL  ?  Comment: Glucose reference range applies only to samples taken after fasting for at least 8 hours.  ? BUN 11 8 - 23 mg/dL  ? Creatinine, Ser 0.77 0.44 - 1.00 mg/dL  ? Calcium 8.5 (L) 8.9 - 10.3 mg/dL  ? Total Protein 6.7 6.5 - 8.1 g/dL  ? Albumin 3.8 3.5 - 5.0 g/dL  ? AST 211 (H) 15 - 41 U/L  ? ALT 150 (H) 0 - 44 U/L  ? Alkaline Phosphatase 130 (H) 38 - 126 U/L  ? Total Bilirubin 0.6 0.3 - 1.2 mg/dL  ? GFR, Estimated >60 >60 mL/min  ?  Comment: (NOTE) ?Calculated using the CKD-EPI Creatinine Equation (2021) ?  ? Anion gap 3 (L) 5 - 15  ?  Comment: Performed at [facility], [address] [place], [place] [address]  ?Magnesium     Status: None  ? Collection Time: 03/02/22  5:57 AM  ?Result Value Ref Range  ? Magnesium 2.1 1.7 - 2.4 mg/dL  ?  Comment: Performed at [facility], [address], [place], [place] [address]  ?CBC with Differential/Platelet     Status: None  ? Collection Time: 03/02/22  5:57 AM  ?Result Value Ref Range  ? WBC 4.8 4.0 - 10.5 K/uL  ? RBC 4.53 3.87 - 5.11 MIL/uL  ? Hemoglobin 12.3 12.0 - 15.0 g/dL  ? HCT 40.0 36.0 - 46.0 %  ? MCV 88.3 80.0 - 100.0 fL  ? MCH 27.2 26.0 - 34.0 pg  ? MCHC 30.8 30.0 - 36.0 g/dL  ? RDW 14.0 11.5 - 15.5 %  ? Platelets 209 150 - 400 K/uL  ? nRBC 0.0 0.0 - 0.2 %  ? Neutrophils Relative % 68 %  ? Neutro Abs 3.3 1.7 - 7.7 K/uL  ? Lymphocytes Relative 21 %  ? Lymphs Abs 1.0 0.7 - 4.0 K/uL  ? Monocytes Relative 9 %  ? Monocytes Absolute 0.4 0.1 - 1.0 K/uL  ? Eosinophils Relative 1 %  ? Eosinophils Absolute 0.1 0.0 - 0.5 K/uL  ? Basophils Relative 1 %  ? Basophils Absolute 0.1 0.0 - 0.1 K/uL  ? Immature Granulocytes 0 %  ? Abs Immature Granulocytes 0.00 0.00 - 0.07 K/uL   ?  Comment: Performed at [facility], [address] [place], [place] [address]  ? ?Personally reviewed- stones, dilated gallbladder and duct  ?CT ABDOMEN PELVIS W CONTRAST ? ?Result Date: 03/02/2022 ?CLI

## 2022-03-02 NOTE — Consult Note (Signed)
? ? ?Referring Provider: No ref. provider found ?Primary Care Physician:  Pcp, No ?Primary Gastroenterologist:  Dr. Marland Kitchen ?Reason for Consultation: Cholelithiasis, elevated LFTs, dilated bile duct ? ?HPI:  ? ?71 year old lady native of [place] relocated to [place]  - back down here on vacation presented to the ED yesterday with recurrent right upper quadrant abdominal pain associated nausea and vomiting..  Multiple bouts over the past 6 weeks lasting several hours and then subsiding.  Overnight, her abdominal pain has resolved. ?Diagnosed with symptomatic cholelithiasis in [place] last month; slated to have her gallbladder removed June 28. ?Work-up this admission revealed mildly dilated gallbladder with cholelithiasis 11 mm CBD with mild intrahepatic dilation.  No common duct stone seen. ?Pancreas appeared normal. ?LFTs: ?AST : 404 to 211; ALT 204 to 150; alkaline phosphatase 170 to 130; total bilirubin 0.9 to 0.6 ?Lipase 32 ?White blood cell count 7.4.  Hemoglobin 14.9 ? ?GI history significant for GERD doing well now without meds.  No dysphagia. ? 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 ?Perforated appendicitis last year for which she underwent appendectomy. ? ?History of colonic polyps - last exam colonoscopy 2022; 5-year surveillance recommended. ? ?Past medical history significant for COPD.  She uses an inhaler.  States she can walk around the block without any problems.  She continues to smoke! ? ?Past Medical History:  ?Diagnosis Date  ? COPD (chronic obstructive pulmonary disease) (HCC)   ? Hypertension   ? ? ?Past Surgical History:  ?Procedure Laterality Date  ? APPENDECTOMY    ? ? ?Prior to Admission medications   ?Not on File  ? ? ?Current Facility-Administered Medications  ?Medication Dose Route Frequency Provider Last Rate Last Admin  ? 0.9 %  sodium chloride infusion   Intravenous Continuous Zierle-Ghosh, Asia B, DO 75 mL/hr at 03/02/22 0424 New Bag at  03/02/22 0424  ? acetaminophen (TYLENOL) tablet 650 mg  650 mg Oral Q6H PRN Zierle-Ghosh, Asia B, DO      ? Or  ? acetaminophen (TYLENOL) suppository 650 mg  650 mg Rectal Q6H PRN Zierle-Ghosh, Asia B, DO      ? albuterol (VENTOLIN HFA) 108 (90 Base) MCG/ACT inhaler 1 puff  1 puff Inhalation Q4H PRN Zierle-Ghosh, Asia B, DO      ? amLODipine (NORVASC) tablet 5 mg  5 mg Oral Daily Zierle-Ghosh, Asia B, DO   5 mg at 03/02/22 1035  ? heparin injection 5,000 Units  5,000 Units Subcutaneous Q8H Zierle-Ghosh, Asia B, DO   5,000 Units at 03/02/22 0540  ? morphine (PF) 2 MG/ML injection 2 mg  2 mg Intravenous Q2H PRN Zierle-Ghosh, Asia B, DO      ? ondansetron (ZOFRAN) tablet 4 mg  4 mg Oral Q6H PRN Zierle-Ghosh, Asia B, DO      ? Or  ? ondansetron (ZOFRAN) injection 4 mg  4 mg Intravenous Q6H PRN Zierle-Ghosh, Asia B, DO      ? oxyCODONE (Oxy IR/ROXICODONE) immediate release tablet 5 mg  5 mg Oral Q4H PRN Zierle-Ghosh, Asia B, DO      ? piperacillin-tazobactam (ZOSYN) IVPB 3.375 g  3.375 g Intravenous Q8H Zierle-Ghosh, Asia B, DO   Stopped at 03/02/22 0706  ? ? ?Allergies as of 03/02/2022  ? (No Known Allergies)  ? ? ?History reviewed. No pertinent family history. ? ?Social History  ? ?Socioeconomic History  ? Marital status: Married  ?  Spouse name: Not on file  ? Number  of children: Not on file  ? Years of education: Not on file  ? Highest education level: Not on file  ?Occupational History  ? Not on file  ?Tobacco Use  ? Smoking status: Every Day  ?  Types: Cigarettes  ? Smokeless tobacco: Never  ?Vaping Use  ? Vaping Use: Never used  ?Substance and Sexual Activity  ? Alcohol use: Not Currently  ? Drug use: Never  ? Sexual activity: Not on file  ?Other Topics Concern  ? Not on file  ?Social History Narrative  ? Not on file  ? ?Social Determinants of Health  ? ?Financial Resource Strain: Not on file  ?Food Insecurity: Not on file  ?Transportation Needs: Not on file  ?Physical Activity: Not on file  ?Stress: Not on file   ?Social Connections: Not on file  ?Intimate Partner Violence: Not on file  ? ? ?Review of Systems: ? ?As in history of present illness. ? ?Physical Exam: ?Vital signs in last 24 hours: ?Temp:  [97.5 ?F (36.4 ?C)-97.7 ?F (36.5 ?C)] 97.7 ?F (36.5 ?C) (04/30 4825) ?Pulse Rate:  [63-90] 78 (04/30 0806) ?Resp:  [13-19] 13 (04/30 0730) ?BP: (112-152)/(59-80) 116/59 (04/30 1035) ?SpO2:  [90 %-98 %] 90 % (04/30 0806) ?Weight:  [67.6 kg-69.4 kg] 69.4 kg (04/30 0806) ?  ?General:   Alert,  pleasant and cooperative in NAD ?Eyes:  Sclera clear, no icterus.   Conjunctiva pink. ?Lungs:  Clear throughout to auscultation.   No wheezes, crackles, or rhonchi. No acute distress. ?Heart:  Regular rate and rhythm; no murmurs, clicks, rubs,  or gallops. ?Abdomen: Nondistended.  Vertical laparotomy and right lower quadrant surgical scars.  Positive bowel sounds.  Soft and nontender without appreciable mass organomegaly ? ?Intake/Output from previous day: ?04/29 0701 - 04/30 0700 ?In: 1000 [IV Piggyback:1000] ?Out: -  ?Intake/Output this shift: ?Total I/O ?In: 34 [IV Piggyback:50] ?Out: -  ? ?Lab Results: ?Recent Labs  ?  03/02/22 ?0214 03/02/22 ?0037  ?WBC 7.4 4.8  ?HGB 14.9 12.3  ?HCT 45.8 40.0  ?PLT 232 209  ? ?BMET ?Recent Labs  ?  03/02/22 ?0214 03/02/22 ?0488  ?NA 134* 140  ?K 3.5 4.4  ?CL 99 108  ?CO2 26 29  ?GLUCOSE 117* 111*  ?BUN 13 11  ?CREATININE 0.75 0.77  ?CALCIUM 9.2 8.5*  ? ?LFT ?Recent Labs  ?  03/02/22 ?8916  ?PROT 6.7  ?ALBUMIN 3.8  ?AST 211*  ?ALT 150*  ?ALKPHOS 130*  ?BILITOT 0.6  ? ?PT/INR ?No results for input(s): LABPROT, INR in the last 72 hours. ?Hepatitis Panel ?No results for input(s): HEPBSAG, HCVAB, HEPAIGM, HEPBIGM in the last 72 hours. ?C-Diff ?No results for input(s): CDIFFTOX in the last 72 hours. ? ?Studies/Results: ?CT ABDOMEN PELVIS W CONTRAST ? ?Result Date: 03/02/2022 ?CLINICAL DATA:  Acute abdominal pain, history of gallbladder disease EXAM: CT ABDOMEN AND PELVIS WITH CONTRAST TECHNIQUE:  Multidetector CT imaging of the abdomen and pelvis was performed using the standard protocol following bolus administration of intravenous contrast. RADIATION DOSE REDUCTION: This exam was performed according to the departmental dose-optimization program which includes automated exposure control, adjustment of the mA and/or kV according to patient size and/or use of iterative reconstruction technique. CONTRAST:  OMNIPAQUE IOHEXOL 300 MG/ML  SOLN COMPARISON:  None. FINDINGS: Lower chest: No acute abnormality. Hepatobiliary: Liver is within normal limits. Gallbladder is well distended with dependent gallstones. Distal common bile duct is dilated to 11 mm. Mild intrahepatic ductal dilatation is seen. No definitive stone is noted  in the distal common bile duct. Pancreas: Unremarkable. No pancreatic ductal dilatation or surrounding inflammatory changes. Spleen: Normal in size without focal abnormality. Adrenals/Urinary Tract: Adrenal glands are within normal limits. Kidneys demonstrate a normal enhancement pattern bilaterally. No renal calculi are noted. Simple cyst is noted in the midportion of the left kidney. Bladder is well distended. No obstructive changes are seen. Prominent extrarenal pelvis is noted on the right. Stomach/Bowel: Scattered diverticular change of the colon is noted without evidence of diverticulitis. The appendix has been surgically removed. Small bowel and stomach are within normal limits with the exception of a small hiatal hernia. Vascular/Lymphatic: Aortic atherosclerosis. No enlarged abdominal or pelvic lymph nodes. Reproductive: Calcified uterine fibroid is noted. Uterus is otherwise within normal limits. Adnexa are unremarkable. Other: No abdominal wall hernia or abnormality. No abdominopelvic ascites. Musculoskeletal: No acute or significant osseous findings. IMPRESSION: Dilated gallbladder with evidence of mild cholelithiasis. Prominence of the common bile duct is noted to 11 mm  although no definitive bile duct stone is seen. Mild intrahepatic ductal dilatation noted as well. Diverticulosis without diverticulitis. Electronically Signed   By: Alcide CleverMark  Lukens M.D.   On: 03/02/2022 03:16   ? ?Impres

## 2022-03-02 NOTE — Progress Notes (Signed)
Patient seen and examined.  Admitted after midnight secondary to abdominal pain and nausea; no fever, no vomiting, no chest pain or shortness of breath.  Prior history of cholelithiasis.  No elevation of WBCs and normal bilirubin levels.  CT abdomen demonstrating the presence of dilated gallbladder with cholelithiasis; there is also bile duct prominence at 11 mm without definitive presence of retained stone.  Patient's symptoms consistent with biliary colic.  There is also mild transaminitis that is already improving with supportive care.  Please refer to H&P written by Dr. Clearence Ped for further info/details on admission. ? ?Plan: ?-GI service and general surgery has been consulted ?-bowel rest and slowly advance to clear liquids for now. ?-Continue fluid resuscitation, supportive care, antiemetics and analgesics. ?-Follow LFTs trend. ? ?Barton Dubois MD ?[phone] ?

## 2022-03-03 ENCOUNTER — Encounter (HOSPITAL_COMMUNITY)
Admission: EM | Disposition: A | Discharge status: Home / Self Care | Payer: Self-pay | Source: Home / Self Care | Attending: Emergency Medicine

## 2022-03-03 ENCOUNTER — Encounter (HOSPITAL_COMMUNITY): Payer: Self-pay | Admitting: Family Medicine

## 2022-03-03 ENCOUNTER — Other Ambulatory Visit: Payer: Self-pay

## 2022-03-03 ENCOUNTER — Observation Stay (HOSPITAL_COMMUNITY): Payer: 59 | Admitting: Certified Registered Nurse Anesthetist

## 2022-03-03 ENCOUNTER — Observation Stay (HOSPITAL_BASED_OUTPATIENT_CLINIC_OR_DEPARTMENT_OTHER): Payer: 59 | Admitting: Certified Registered Nurse Anesthetist

## 2022-03-03 ENCOUNTER — Observation Stay (HOSPITAL_COMMUNITY): Payer: 59

## 2022-03-03 DIAGNOSIS — E663 Overweight: Secondary | ICD-10-CM

## 2022-03-03 DIAGNOSIS — R7989 Other specified abnormal findings of blood chemistry: Secondary | ICD-10-CM

## 2022-03-03 DIAGNOSIS — K8020 Calculus of gallbladder without cholecystitis without obstruction: Secondary | ICD-10-CM

## 2022-03-03 DIAGNOSIS — I10 Essential (primary) hypertension: Secondary | ICD-10-CM | POA: Diagnosis not present

## 2022-03-03 DIAGNOSIS — R7401 Elevation of levels of liver transaminase levels: Secondary | ICD-10-CM | POA: Diagnosis not present

## 2022-03-03 DIAGNOSIS — J449 Chronic obstructive pulmonary disease, unspecified: Secondary | ICD-10-CM

## 2022-03-03 DIAGNOSIS — F17210 Nicotine dependence, cigarettes, uncomplicated: Secondary | ICD-10-CM

## 2022-03-03 DIAGNOSIS — K8010 Calculus of gallbladder with chronic cholecystitis without obstruction: Secondary | ICD-10-CM | POA: Diagnosis not present

## 2022-03-03 DIAGNOSIS — K838 Other specified diseases of biliary tract: Secondary | ICD-10-CM | POA: Diagnosis not present

## 2022-03-03 HISTORY — PX: CHOLECYSTECTOMY: SHX55

## 2022-03-03 LAB — COMPREHENSIVE METABOLIC PANEL
ALT: 86 U/L — ABNORMAL HIGH (ref 0–44)
AST: 50 U/L — ABNORMAL HIGH (ref 15–41)
Albumin: 3.2 g/dL — ABNORMAL LOW (ref 3.5–5.0)
Alkaline Phosphatase: 104 U/L (ref 38–126)
Anion gap: 4 — ABNORMAL LOW (ref 5–15)
BUN: 9 mg/dL (ref 8–23)
CO2: 29 mmol/L (ref 22–32)
Calcium: 8.5 mg/dL — ABNORMAL LOW (ref 8.9–10.3)
Chloride: 107 mmol/L (ref 98–111)
Creatinine, Ser: 1.05 mg/dL — ABNORMAL HIGH (ref 0.44–1.00)
GFR, Estimated: 56 mL/min — ABNORMAL LOW (ref >60)
Glucose, Bld: 83 mg/dL (ref 70–99)
Potassium: 4.3 mmol/L (ref 3.5–5.1)
Sodium: 140 mmol/L (ref 135–145)
Total Bilirubin: 0.5 mg/dL (ref 0.3–1.2)
Total Protein: 6 g/dL — ABNORMAL LOW (ref 6.5–8.1)

## 2022-03-03 LAB — SURGICAL PCR SCREEN
MRSA, PCR: NEGATIVE
Staphylococcus aureus: NEGATIVE

## 2022-03-03 SURGERY — LAPAROSCOPIC CHOLECYSTECTOMY WITH INTRAOPERATIVE CHOLANGIOGRAM
Anesthesia: General | Site: Abdomen

## 2022-03-03 MED ORDER — ROCURONIUM BROMIDE 10 MG/ML (PF) SYRINGE
PREFILLED_SYRINGE | INTRAVENOUS | Status: AC
  Filled 2022-03-03: qty 10

## 2022-03-03 MED ORDER — DEXAMETHASONE SODIUM PHOSPHATE 10 MG/ML IJ SOLN
INTRAMUSCULAR | Status: AC
  Filled 2022-03-03: qty 1

## 2022-03-03 MED ORDER — ORAL CARE MOUTH RINSE: 15.0000 mL | Freq: Once | OROMUCOSAL | Status: AC

## 2022-03-03 MED ORDER — PROPOFOL 10 MG/ML IV BOLUS
INTRAVENOUS | Status: DC | PRN
  Administered 2022-03-03: 160 mg via INTRAVENOUS

## 2022-03-03 MED ORDER — CHLORHEXIDINE GLUCONATE 0.12 % MT SOLN
15.0000 mL | Freq: Once | OROMUCOSAL | Status: AC
  Administered 2022-03-03: 15 mL via OROMUCOSAL

## 2022-03-03 MED ORDER — FENTANYL CITRATE PF 50 MCG/ML IJ SOSY: 25.0000 ug | PREFILLED_SYRINGE | INTRAMUSCULAR | Status: DC | PRN

## 2022-03-03 MED ORDER — SUGAMMADEX SODIUM 200 MG/2ML IV SOLN
INTRAVENOUS | Status: DC | PRN
  Administered 2022-03-03: 200 mg via INTRAVENOUS

## 2022-03-03 MED ORDER — LACTATED RINGERS IV SOLN: INTRAVENOUS | Status: DC

## 2022-03-03 MED ORDER — ONDANSETRON HCL 4 MG/2ML IJ SOLN
INTRAMUSCULAR | Status: AC
  Filled 2022-03-03: qty 2

## 2022-03-03 MED ORDER — BUPIVACAINE HCL (PF) 0.5 % IJ SOLN
INTRAMUSCULAR | Status: AC
  Filled 2022-03-03: qty 30

## 2022-03-03 MED ORDER — ROCURONIUM 10MG/ML (10ML) SYRINGE FOR MEDFUSION PUMP - OPTIME
INTRAVENOUS | Status: DC | PRN
  Administered 2022-03-03: 40 mg via INTRAVENOUS

## 2022-03-03 MED ORDER — SCOPOLAMINE 1 MG/3DAYS TD PT72
1.0000 | MEDICATED_PATCH | Freq: Once | TRANSDERMAL | Status: DC
  Administered 2022-03-03: 1.5 mg via TRANSDERMAL

## 2022-03-03 MED ORDER — SCOPOLAMINE 1 MG/3DAYS TD PT72
MEDICATED_PATCH | TRANSDERMAL | Status: AC
  Filled 2022-03-03: qty 1

## 2022-03-03 MED ORDER — LIDOCAINE HCL (PF) 2 % IJ SOLN
INTRAMUSCULAR | Status: AC
  Filled 2022-03-03: qty 5

## 2022-03-03 MED ORDER — ONDANSETRON HCL 4 MG/2ML IJ SOLN: 4.0000 mg | Freq: Once | INTRAMUSCULAR | Status: DC | PRN

## 2022-03-03 MED ORDER — ONDANSETRON HCL 4 MG/2ML IJ SOLN
INTRAMUSCULAR | Status: DC | PRN
  Administered 2022-03-03: 4 mg via INTRAVENOUS

## 2022-03-03 MED ORDER — FENTANYL CITRATE (PF) 100 MCG/2ML IJ SOLN
INTRAMUSCULAR | Status: AC
  Filled 2022-03-03: qty 2

## 2022-03-03 MED ORDER — SODIUM CHLORIDE 0.9 % IV SOLN
2.0000 g | INTRAVENOUS | Status: AC
  Administered 2022-03-03: 2 g via INTRAVENOUS
  Filled 2022-03-03 (×2): qty 2

## 2022-03-03 MED ORDER — FENTANYL CITRATE (PF) 100 MCG/2ML IJ SOLN
INTRAMUSCULAR | Status: DC | PRN
  Administered 2022-03-03 (×4): 50 ug via INTRAVENOUS

## 2022-03-03 MED ORDER — LIDOCAINE HCL (CARDIAC) PF 50 MG/5ML IV SOSY
PREFILLED_SYRINGE | INTRAVENOUS | Status: DC | PRN
  Administered 2022-03-03: 75 mg via INTRAVENOUS

## 2022-03-03 MED ORDER — 0.9 % SODIUM CHLORIDE (POUR BTL) OPTIME
TOPICAL | Status: DC | PRN
  Administered 2022-03-03: 1000 mL

## 2022-03-03 MED ORDER — FENTANYL CITRATE (PF) 100 MCG/2ML IJ SOLN
INTRAMUSCULAR | Status: AC
Start: 2022-03-03 — End: ?
  Filled 2022-03-03: qty 2

## 2022-03-03 MED ORDER — CHLORHEXIDINE GLUCONATE CLOTH 2 % EX PADS
6.0000 | MEDICATED_PAD | Freq: Once | CUTANEOUS | Status: AC
  Administered 2022-03-03: 6 via TOPICAL

## 2022-03-03 MED ORDER — SODIUM CHLORIDE (PF) 0.9 % IJ SOLN
INTRAMUSCULAR | Status: DC | PRN
  Administered 2022-03-03: 15 mL

## 2022-03-03 MED ORDER — HEMOSTATIC AGENTS (NO CHARGE) OPTIME
TOPICAL | Status: DC | PRN
  Administered 2022-03-03: 1 via TOPICAL

## 2022-03-03 MED ORDER — BUPIVACAINE HCL (PF) 0.5 % IJ SOLN
INTRAMUSCULAR | Status: DC | PRN
  Administered 2022-03-03: 10 mL

## 2022-03-03 SURGICAL SUPPLY — 46 items
APPLIER CLIP ROT 10 11.4 M/L (STAPLE) ×2
BAG DECANTER FOR FLEXI CONT (MISCELLANEOUS) ×2 IMPLANT
BAG RETRIEVAL 10 (BASKET) ×1
BLADE SURG 15 STRL LF DISP TIS (BLADE) ×1 IMPLANT
BLADE SURG 15 STRL SS (BLADE) ×1
CATH CHOLANGIOGRAM 4.5FR (CATHETERS) ×1 IMPLANT
CHLORAPREP W/TINT 26 (MISCELLANEOUS) ×2 IMPLANT
CLIP APPLIE ROT 10 11.4 M/L (STAPLE) ×1 IMPLANT
CLOTH BEACON ORANGE TIMEOUT ST (SAFETY) ×2 IMPLANT
COVER LIGHT HANDLE STERIS (MISCELLANEOUS) ×4 IMPLANT
DECANTER SPIKE VIAL GLASS SM (MISCELLANEOUS) ×3 IMPLANT
DERMABOND ADVANCED (GAUZE/BANDAGES/DRESSINGS) ×1
DERMABOND ADVANCED .7 DNX12 (GAUZE/BANDAGES/DRESSINGS) ×1 IMPLANT
DRAPE C-ARM FOLDED MOBILE STRL (DRAPES) ×2 IMPLANT
ELECT REM PT RETURN 9FT ADLT (ELECTROSURGICAL) ×2
ELECTRODE REM PT RTRN 9FT ADLT (ELECTROSURGICAL) ×1 IMPLANT
GLOVE BIO SURGEON STRL SZ 6.5 (GLOVE) ×2 IMPLANT
GLOVE BIOGEL PI IND STRL 6.5 (GLOVE) ×1 IMPLANT
GLOVE BIOGEL PI IND STRL 7.0 (GLOVE) ×2 IMPLANT
GLOVE BIOGEL PI INDICATOR 6.5 (GLOVE) ×1
GLOVE BIOGEL PI INDICATOR 7.0 (GLOVE) ×3
GOWN STRL REUS W/TWL LRG LVL3 (GOWN DISPOSABLE) ×6 IMPLANT
HEMOSTAT SNOW SURGICEL 2X4 (HEMOSTASIS) ×2 IMPLANT
INST SET LAPROSCOPIC AP (KITS) ×2 IMPLANT
KIT TURNOVER KIT A (KITS) ×2 IMPLANT
MANIFOLD NEPTUNE II (INSTRUMENTS) ×2 IMPLANT
NEEDLE INSUFFLATION 120MM (ENDOMECHANICALS) ×2 IMPLANT
NS IRRIG 1000ML POUR BTL (IV SOLUTION) ×2 IMPLANT
PACK LAP CHOLE LZT030E (CUSTOM PROCEDURE TRAY) ×2 IMPLANT
PAD ARMBOARD 7.5X6 YLW CONV (MISCELLANEOUS) ×2 IMPLANT
SET BASIN LINEN APH (SET/KITS/TRAYS/PACK) ×2 IMPLANT
SET TUBE IRRIG SUCTION NO TIP (IRRIGATION / IRRIGATOR) IMPLANT
SET TUBE SMOKE EVAC HIGH FLOW (TUBING) ×2 IMPLANT
SLEEVE ENDOPATH XCEL 5M (ENDOMECHANICALS) ×2 IMPLANT
SUT MNCRL AB 4-0 PS2 18 (SUTURE) ×4 IMPLANT
SUT VICRYL 0 UR6 27IN ABS (SUTURE) ×2 IMPLANT
SYR 20ML LL LF (SYRINGE) ×2 IMPLANT
SYR 30ML LL (SYRINGE) ×2 IMPLANT
SYR CONTROL 10ML LL (SYRINGE) ×2 IMPLANT
SYS BAG RETRIEVAL 10MM (BASKET) ×1
SYSTEM BAG RETRIEVAL 10MM (BASKET) ×1 IMPLANT
TROCAR ENDO BLADELESS 11MM (ENDOMECHANICALS) ×2 IMPLANT
TROCAR XCEL NON-BLD 5MMX100MML (ENDOMECHANICALS) ×2 IMPLANT
TROCAR XCEL UNIV SLVE 11M 100M (ENDOMECHANICALS) ×2 IMPLANT
TUBE CONNECTING 12X1/4 (SUCTIONS) ×2 IMPLANT
WARMER LAPAROSCOPE (MISCELLANEOUS) ×2 IMPLANT

## 2022-03-03 NOTE — Progress Notes (Signed)
?  Progress Note ? ? ?Patient: Gabriella Pollard JQB:[id number] DOB: [birth year] DOA: 03/02/2022     0 ?DOS: the patient was seen and examined on 03/03/2022 ?  ?Brief hospital course: ?71 year old female with history of cholelithiasis who lives in [place] and was here visiting; presented to the hospital secondary to right upper quadrant pain, nausea and vomiting.  This is patient's second visit that requires hospitalization and she was planned to have gallbladder surgery in June.  Patient was found with elevated LFTs and dilated CBD; no stone obstruction appreciated. ? ?Assessment and Plan: ?* Transaminitis ?-AST 404, ALT 204 ?-Appears to be associated with passing a gallstone; patient with cholelithiasis. ?-CT scan demonstrating dilated CBD but no frank stone obstruction. ?-With fluid resuscitation and supportive care LFTs is trending down significantly and the patient condition stabilized. ?-There is no signs of cholecystitis. ?-Case discussed with general surgery planning to perform laparoscopic cholecystectomy later today. ?-Patient is NPO. ?-Continue to follow LFTs trend ?-During surgery cholangiogram will be done. ?-Appreciate assistance and recommendations by GI. ? ?Calculus of gallbladder without cholecystitis without obstruction ?-Plan is for laparoscopic cholecystectomy later today. ?-will follow postoperative recommendation by general surgery. ?-Continue supportive care, analgesia, antiemetics and n.p.o. status. ? ?Essential hypertension ?- Stable overall ?-Continue current antihypertensive regimen. ? ?COPD (chronic obstructive pulmonary disease) (HCC) ?-Continue as needed albuterol inhaler ?-Continue to monitor ? ?Common bile duct dilatation ?- Empirically started on antibiotics; no signs of cholecystitis. ?-Patient is afebrile and WBCs within normal limit ?-Will recommend discontinuation of antibiotics if general surgery in agreement. ?-LFTs trending down appropriately; plan is for cholecystectomy and cholangiogram  during surgery. ?-Appreciate recommendations by GI. ? ?Overweight: ?-Body mass index is 28.91 kg/m?. ?-Low calorie diet, portion control and increase physical activity discussed with patient. ? ?Subjective:  ?Patient is n.p.o. and feeling hungry; no significant abdominal discomfort.  No chest pain, no shortness of breath, no fever.  Ready to have her gallbladder removed. ? ?Physical Exam: ?Vitals:  ? 03/03/22 1430 03/03/22 1445 03/03/22 1500 03/03/22 1522  ?BP: (!) 163/73 (!) 157/66 (!) 147/63 (!) 160/67  ?Pulse: 85 70 65 70  ?Resp: 17 18 12    ?Temp:    97.7 ?F (36.5 ?C)  ?TempSrc:    Oral  ?SpO2: 92% 90% 99% 93%  ?Weight:      ?Height:      ? ?General exam: Alert, awake, oriented x 3; afebrile, no nausea or vomiting currently.  Reports no significant discomfort in her abdomen.  She is n.p.o. and hungry. ?Respiratory system: Clear to auscultation. Respiratory effort normal.  Good saturation on room air. ?Cardiovascular system:RRR.  No rubs or gallops, no JVD. ?Gastrointestinal system: Abdomen is nondistended, soft and without guarding.  No organomegaly or masses felt. Normal bowel sounds heard. ?Central nervous system: Alert and oriented. No focal neurological deficits. ?Extremities: No cyanosis or clubbing. ?Skin: No petechiae. ?Psychiatry: Judgement and insight appear normal. Mood & affect appropriate.  ? ?Data Reviewed: ?Complete metabolic panel with a sodium of 140, potassium 4.3, BUN 9, creatinine 1.05, AST 50, ALT 86 ? ?Family Communication: No family at bedside. ? ?Disposition: ?Status is: Observation ?The patient remains OBS appropriate and will d/c before 2 midnights. ? ? Planned Discharge Destination: Home ? ? ?Author: ? , MD ?03/03/2022 5:26 PM ? ?For on call review [email or web address].  ?

## 2022-03-03 NOTE — Interval H&P Note (Signed)
History and Physical Interval Note: ? ?03/03/2022 ?12:12 PM ? ?Gabriella Pollard  has presented today for surgery, with the diagnosis of cholelithiasis, elevated liver test.  The various methods of treatment have been discussed with the patient and family. After consideration of risks, benefits and other options for treatment, the patient has consented to  Procedure(s): ?LAPAROSCOPIC CHOLECYSTECTOMY WITH INTRAOPERATIVE CHOLANGIOGRAM (N/A) as a surgical intervention.  The patient's history has been reviewed, patient examined, no change in status, stable for surgery.  I have reviewed the patient's chart and labs.  Questions were answered to the patient's satisfaction.   ? ? ?Lucretia Roers ? ? ?

## 2022-03-03 NOTE — Transfer of Care (Signed)
Immediate Anesthesia Transfer of Care Note ? ?Patient: Gabriella Pollard ? ?Procedure(s) Performed: LAPAROSCOPIC CHOLECYSTECTOMY WITH INTRAOPERATIVE CHOLANGIOGRAM (Abdomen) ? ?Patient Location: PACU ? ?Anesthesia Type:General ? ?Level of Consciousness: awake ? ?Airway & Oxygen Therapy: Patient Spontanous Breathing ? ?Post-op Assessment: Report given to RN ? ?Post vital signs: Reviewed and stable ? ?Last Vitals:  ?Vitals Value Taken Time  ?BP 179/73 03/03/22 1416  ?Temp 36.6 ?C 03/03/22 1415  ?Pulse 91 03/03/22 1421  ?Resp 17 03/03/22 1421  ?SpO2 95 % 03/03/22 1421  ?Vitals shown include unvalidated device data. ? ?Last Pain:  ?Vitals:  ? 03/03/22 1415  ?TempSrc:   ?PainSc: 0-No pain  ?   ? ?  ? ?Complications: No notable events documented. ?

## 2022-03-03 NOTE — Assessment & Plan Note (Addendum)
-  Status post laparoscopic cholecystectomy; well-tolerated. ?-Tolerating diet at time of discharge ?-As needed analgesics and antiemetics prescribed. ?-Advised to maintain herself adequately hydrated. ? ?

## 2022-03-03 NOTE — Op Note (Addendum)
Operative Note ?  ?Preoperative Diagnosis: Symptomatic cholelithiasis, dilated common bile duct, elevated liver function test  ?  ?Postoperative Diagnosis: Same ?  ?Procedure(s) Performed: Laparoscopic cholecystectomy, intraoperative cholangiogram  ?  ?Surgeon: Leatrice Jewels. Henreitta Leber, MD ?  ?Assistants: Theophilus Kinds, DO ?  ?Anesthesia: General endotracheal ?  ?Anesthesiologist: Windell Norfolk, MD  ?  ?Specimens: Gallbladder  ?  ?Estimated Blood Loss: Minimal  ?  ?Blood Replacement: None  ?  ?Complications: None  ?  ?Operative Findings: Cholangiogram with dilated common bile duct, flow into the hepatic ducts, and no fillling defect  ?  ?Procedure: The patient was taken to the operating room and placed supine. General endotracheal anesthesia was induced. Intravenous antibiotics were administered per protocol. An orogastric tube positioned to decompress the stomach. The abdomen was prepared and draped in the usual sterile fashion.  ?  ?A supraumbilical incision was made and a Veress technique was utilized to achieve pneumoperitoneum to 15 mmHg with carbon dioxide. A 11 mm optiview port was placed through the supraumbilical region, and a 10 mm 0-degree operative laparoscope was introduced. The area underlying the trocar and Veress needle were inspected and without evidence of injury.  Remaining trocars were placed under direct vision. Two 5 mm ports were placed in the right abdomen, between the anterior axillary and midclavicular line.  A final 11 mm port was placed through the mid-epigastrium, near the falciform ligament.  ?  ?The gallbladder fundus was elevated cephalad and the infundibulum was retracted to the patient's right. The gallbladder/cystic duct junction was skeletonized. The cystic artery noted in the triangle of Calot and was also skeletonized.  We then continued liberal medial and lateral dissection until the critical view of safety was achieved.  ?  ?The cystic artery was triply clipped and divided.  The cystic duct was clipped close to the gallbladder and a ductotomy was made. The cholangiogram catheter was placed and threaded into the cystic duct. The cholangiogram was ran and there was filling into the common duct which was dilated, the hepatic ducts filled, and there was no filling defect at the ampulla, the duodenum filled easily. The duct was then flushed with saline, and the proximal cystic duct was closed twice and divided the rest of the way. The gallbladder was then dissected from the liver bed with electrocautery. The specimen was placed in an Endopouch and was retrieved through the epigastric site. ?  ?Dr. Robyne Peers was assisting during the case to help with the cholangiogram and the identification of the critical view. ? ?Final inspection revealed acceptable hemostasis. Surgical SNOW was placed in the gallbladder bed.  Trocars were removed and pneumoperitoneum was released.  0 Vicryl fascial sutures were used to close the epigastric and umbilical port sites. Skin incisions were closed with 4-0 Monocryl subcuticular sutures and Dermabond. The patient was awakened from anesthesia and extubated without complication.  ?  ?Algis Greenhouse, MD ?[facility] ?[address] ?[place], [place] [address] ?[phone] (office) ?  ?

## 2022-03-03 NOTE — Anesthesia Procedure Notes (Signed)
Procedure Name: Intubation ?Date/Time: 03/03/2022 1:06 PM ?Performed by: Ollen Bowl, CRNA ?Pre-anesthesia Checklist: Patient identified, Patient being monitored, Timeout performed, Emergency Drugs available and Suction available ?Patient Re-evaluated:Patient Re-evaluated prior to induction ?Oxygen Delivery Method: Circle system utilized ?Preoxygenation: Pre-oxygenation with 100% oxygen ?Induction Type: IV induction ?Ventilation: Mask ventilation without difficulty ?Laryngoscope Size: Mac and 3 ?Grade View: Grade I ?Tube type: Oral ?Tube size: 7.0 mm ?Number of attempts: 1 ?Airway Equipment and Method: Stylet ?Placement Confirmation: ETT inserted through vocal cords under direct vision, positive ETCO2 and breath sounds checked- equal and bilateral ?Secured at: 21 cm ?Tube secured with: Tape ?Dental Injury: Teeth and Oropharynx as per pre-operative assessment  ? ? ? ? ?

## 2022-03-03 NOTE — Anesthesia Preprocedure Evaluation (Signed)
Anesthesia Evaluation  ?Patient identified by MRN, date of birth, ID band ?Patient awake ? ? ? ?Reviewed: ?Allergy & Precautions, H&P , NPO status , Patient's Chart, lab work & pertinent test results, reviewed documented beta blocker date and time  ? ?Airway ?Mallampati: II ? ?TM Distance: >3 FB ?Neck ROM: full ? ? ? Dental ?no notable dental hx. ?(+) Missing ?  ?Pulmonary ?COPD, Current Smoker,  ?  ?Pulmonary exam normal ?breath sounds clear to auscultation ? ? ? ? ? ? Cardiovascular ?Exercise Tolerance: Good ?hypertension, negative cardio ROS ? ? ?Rhythm:regular Rate:Normal ? ? ?  ?Neuro/Psych ?negative neurological ROS ? negative psych ROS  ? GI/Hepatic ?negative GI ROS, Neg liver ROS,   ?Endo/Other  ?negative endocrine ROS ? Renal/GU ?negative Renal ROS  ?negative genitourinary ?  ?Musculoskeletal ? ? Abdominal ?  ?Peds ? Hematology ?negative hematology ROS ?(+)   ?Anesthesia Other Findings ? ? Reproductive/Obstetrics ?negative OB ROS ? ?  ? ? ? ? ? ? ? ? ? ? ? ? ? ?  ?  ? ? ? ? ? ? ? ? ?Anesthesia Physical ?Anesthesia Plan ? ?ASA: 2 ? ?Anesthesia Plan: General and General ETT  ? ?Post-op Pain Management:   ? ?Induction:  ? ?PONV Risk Score and Plan: Ondansetron and Scopolamine patch - Pre-op ? ?Airway Management Planned:  ? ?Additional Equipment:  ? ?Intra-op Plan:  ? ?Post-operative Plan:  ? ?Informed Consent: I have reviewed the patients History and Physical, chart, labs and discussed the procedure including the risks, benefits and alternatives for the proposed anesthesia with the patient or authorized representative who has indicated his/her understanding and acceptance.  ? ? ? ?Dental Advisory Given ? ?Plan Discussed with: CRNA ? ?Anesthesia Plan Comments:   ? ? ? ? ? ? ?Anesthesia Quick Evaluation ? ?

## 2022-03-04 DIAGNOSIS — R7401 Elevation of levels of liver transaminase levels: Secondary | ICD-10-CM | POA: Diagnosis not present

## 2022-03-04 DIAGNOSIS — K8020 Calculus of gallbladder without cholecystitis without obstruction: Secondary | ICD-10-CM | POA: Diagnosis not present

## 2022-03-04 DIAGNOSIS — K219 Gastro-esophageal reflux disease without esophagitis: Secondary | ICD-10-CM

## 2022-03-04 DIAGNOSIS — K838 Other specified diseases of biliary tract: Secondary | ICD-10-CM | POA: Diagnosis not present

## 2022-03-04 DIAGNOSIS — K8010 Calculus of gallbladder with chronic cholecystitis without obstruction: Secondary | ICD-10-CM | POA: Diagnosis not present

## 2022-03-04 DIAGNOSIS — J449 Chronic obstructive pulmonary disease, unspecified: Secondary | ICD-10-CM | POA: Diagnosis not present

## 2022-03-04 MED ORDER — ONDANSETRON 8 MG PO TBDP
8.0000 mg | ORAL_TABLET | Freq: Three times a day (TID) | ORAL | 0 refills | Status: AC | PRN
Start: 2022-03-04 — End: ?

## 2022-03-04 MED ORDER — OXYCODONE HCL 5 MG PO TABS: 5.0000 mg | ORAL_TABLET | Freq: Four times a day (QID) | ORAL | 0 refills | Status: AC | PRN

## 2022-03-04 NOTE — Discharge Summary (Signed)
?Physician Discharge Summary ?  ?Patient: Gabriella Pollard MRN: [id number] DOB: [birth year]  ?Admit date:     03/02/2022  ?Discharge date: 03/04/22  ?Discharge Physician: Vassie Loll  ? ?PCP: Pcp, No  ? ?Recommendations at discharge:  ?Repeat complete metabolic panel to follow electrolytes, renal function and LFTs. ?-Continue to follow blood pressure and further adjust antihypertensive treatment as needed ? ?Discharge Diagnoses: ?Principal Problem: ?  Transaminitis ?Active Problems: ?  Common bile duct dilatation ?  COPD (chronic obstructive pulmonary disease) (HCC) ?  Essential hypertension ?  Calculus of gallbladder without cholecystitis without obstruction ?  Gastroesophageal reflux disease ? ?Brief hospital course: ?71 year old female with history of cholelithiasis who lives in [place] and was here visiting; presented to the hospital secondary to right upper quadrant pain, nausea and vomiting.  This is patient's second visit that requires hospitalization and she was planned to have gallbladder surgery in June.  Patient was found with elevated LFTs and dilated CBD; no stone obstruction appreciated. ? ?Assessment and Plan: ?* Transaminitis ?-AST 404, 86.  204 at time of admission. ?-Appears to be associated with passing a gallstone; patient with cholelithiasis. ?-CT scan demonstrating dilated CBD but no frank stone obstruction. ?-With fluid resuscitation and supportive care LFTs has trended down tremendously and at discharge AST 50 and ALT 86. ?-There was no signs of cholecystitis. ?-Case discussed with general surgery patient is status post laparoscopic cholecystectomy. ?-Tolerating diet appropriately and expressing just mild intermittent nausea and some expected postoperative discomfort. ?-Cholangiogram including laparoscopic cholecystectomy demonstrated no presence of retained stone or obstruction. ? ?Gastroesophageal reflux disease ?- Continue PPI. ? ?Calculus of gallbladder without cholecystitis without  obstruction ?-Status post laparoscopic cholecystectomy; well-tolerated. ?-Tolerating diet at time of discharge ?-As needed analgesics and antiemetics prescribed. ?-Advised to maintain herself adequately hydrated. ? ? ?Essential hypertension ?-Stable overall ?-Continue current antihypertensive regimen. ?-Heart healthy diet discussed with patient. ? ?COPD (chronic obstructive pulmonary disease) (HCC) ?-Continue as needed albuterol inhaler ?-Continue to monitor ? ?Common bile duct dilatation ?-Empirically started on antibiotics; no signs of cholecystitis. ?-Patient is afebrile and WBCs within normal limit ?-Antibiotic has been discontinued ?-Patient remains afebrile and LFTs improved and close to normal time of discharge. ?-Appreciate recommendations by GI and general surgery service.. ? ?Overweight ?-Body mass index is 28.91 kg/m?. ?-Low calorie diet, portion control and increase physical activity discussed with patient. ? ?Consultants: GI and general surgery. ?Procedures performed: See below for x-ray reports; scopic cholecystectomy on 03/03/22. ?Disposition: Home ?Diet recommendation: heart healthy diet ? ? ?DISCHARGE MEDICATION: ?Allergies as of 03/04/2022   ?No Known Allergies ?  ? ?  ?Medication List  ?  ? ?STOP taking these medications   ? ?fluconazole 150 MG tablet ?Commonly known as: DIFLUCAN ?  ?nitrofurantoin (macrocrystal-monohydrate) 100 MG capsule ?Commonly known as: MACROBID ?  ? ?  ? ?TAKE these medications   ? ?acetaminophen 500 MG tablet ?Commonly known as: TYLENOL ?Take 500 mg by mouth every 6 (six) hours as needed for moderate pain. ?  ?albuterol 108 (90 Base) MCG/ACT inhaler ?Commonly known as: VENTOLIN HFA ?Inhale 1 puff into the lungs every 6 (six) hours as needed for wheezing or shortness of breath. ?  ?amLODipine 10 MG tablet ?Commonly known as: NORVASC ?Take 10 mg by mouth daily. ?  ?aspirin EC 81 MG tablet ?Take 81 mg by mouth daily. Swallow whole. ?  ?hydrochlorothiazide 12.5 MG tablet ?Commonly  known as: HYDRODIURIL ?Take 12.5 mg by mouth daily. ?  ?ondansetron 8 MG disintegrating tablet ?Commonly known as:  ZOFRAN-ODT ?Take 1 tablet (8 mg total) by mouth every 8 (eight) hours as needed for nausea or vomiting. ?  ?oxyCODONE 5 MG immediate release tablet ?Commonly known as: Oxy IR/ROXICODONE ?Take 1 tablet (5 mg total) by mouth every 6 (six) hours as needed for up to 5 days for severe pain. ?  ?pantoprazole 40 MG tablet ?Commonly known as: PROTONIX ?Take 40 mg by mouth every morning. ?  ?Vitamin D (Ergocalciferol) 1.25 MG (50000 UNIT) Caps capsule ?Commonly known as: DRISDOL ?Take 50,000 Units by mouth once a week. ?  ? ?  ? ? ?Discharge Exam: ?Filed Weights  ? 03/02/22 0108 03/02/22 0806  ?Weight: 67.6 kg 69.4 kg  ? ?General exam: Alert, awake, oriented x 3; reporting mild intermittent nausea and expected postsurgical abdominal pain. ?Respiratory system: Clear to auscultation. Respiratory effort normal.  Good saturation on room air; no using accessory muscle. ?Cardiovascular system:RRR. No murmurs, rubs, gallops.  No JVD. ?Gastrointestinal system: Abdomen is appropriately sore after surgical intervention; positive bowel sounds, no guarding, cysts.  ?Central nervous system: Alert and oriented. No focal neurological deficits. ?Extremities: No C/C/E, +pedal pulses ?Skin: No rashes, lesions or ulcers ?Psychiatry: Judgement and insight appear normal. Mood & affect appropriate.  ? ? ?Condition at discharge: Stable and improved. ? ?The results of significant diagnostics from this hospitalization (including imaging, microbiology, ancillary and laboratory) are listed below for reference.  ? ?Imaging Studies: ?DG Cholangiogram Operative ? ?Result Date: 03/03/2022 ?CLINICAL DATA:  71 year old female with history of cholelithiasis. EXAM: INTRAOPERATIVE CHOLANGIOGRAM TECHNIQUE: Cholangiographic images from the C-arm fluoroscopic device were submitted for interpretation post-operatively. Please see the procedural report  for the amount of contrast and the fluoroscopy time utilized. COMPARISON:  CT abdomen pelvis from 03/02/2022 FINDINGS: Intraoperative antegrade injection via the cystic duct which opacifies the common bile duct and central portions of the intrahepatic biliary tree. Contrast is visualized flowing freely into the duodenum. There are no filling defects. Similar appearing fusiform moderate extrahepatic and central intrahepatic biliary ductal dilation. No apparent anomalous anatomical configuration of the biliary tree. IMPRESSION: Similar appearing moderate dilation of the common bile duct without evidence of choledocholithiasis. Marliss Coots, MD [facility] Electronically Signed   By: Marliss Coots M.D.   On: 03/03/2022 14:33  ? ?CT ABDOMEN PELVIS W CONTRAST ? ?Result Date: 03/02/2022 ?CLINICAL DATA:  Acute abdominal pain, history of gallbladder disease EXAM: CT ABDOMEN AND PELVIS WITH CONTRAST TECHNIQUE: Multidetector CT imaging of the abdomen and pelvis was performed using the standard protocol following bolus administration of intravenous contrast. RADIATION DOSE REDUCTION: This exam was performed according to the departmental dose-optimization program which includes automated exposure control, adjustment of the mA and/or kV according to patient size and/or use of iterative reconstruction technique. CONTRAST:  OMNIPAQUE IOHEXOL 300 MG/ML  SOLN COMPARISON:  None. FINDINGS: Lower chest: No acute abnormality. Hepatobiliary: Liver is within normal limits. Gallbladder is well distended with dependent gallstones. Distal common bile duct is dilated to 11 mm. Mild intrahepatic ductal dilatation is seen. No definitive stone is noted in the distal common bile duct. Pancreas: Unremarkable. No pancreatic ductal dilatation or surrounding inflammatory changes. Spleen: Normal in size without focal abnormality. Adrenals/Urinary Tract: Adrenal glands are within normal  limits. Kidneys demonstrate a normal enhancement pattern bilaterally. No renal calculi are noted. Simple cyst is noted in the midportion of the left kidney. Bladder is well distended. No obstructive changes are see

## 2022-03-04 NOTE — Progress Notes (Signed)
[facility] Progress Note ? ?1 Day Post-Op  ?Subjective: ?Patient seen and evaluated. She says that she is doing well today and feels better than yesterday. She has tolerable pain controlled with pain medications which she reports as 4/10. She reports an episode of vomiting last night after eating dinner, but that she ate some breakfast this morning without issue. She denies nausea. She reports passing flatus, but no bowel movements. She has been up to go to the bathroom and ambulates without issue. She did not require oxygen while ambulating. ? ?Objective: ?Vital signs in last 24 hours: ?Temp:  [97.7 ?F (36.5 ?C)-99 ?F (37.2 ?C)] 99 ?F (37.2 ?C) (05/02 0522) ?Pulse Rate:  [65-96] 73 (05/02 0522) ?Resp:  [12-21] 18 (05/02 0522) ?BP: (121-179)/(51-73) 140/66 (05/02 0522) ?SpO2:  [90 %-99 %] 94 % (05/02 0522) ?Last BM Date : 03/01/22 ? ?Intake/Output from previous day: ?05/01 0701 - 05/02 0700 ?In: 1807.4 [P.O.:480; I.V.:1299.1; IV Piggyback:28.3] ?Out: 20 [Blood:20] ?Intake/Output this shift: ?No intake/output data recorded. ? ?Constitutional: Awake, alert, pleasant. Laying in bed, no acute distress. ?HENT: Normocephalic, atraumatic.  ?Eyes: EOMI, non-icteric sclera. ?Abdomen: Soft, non-tender. Non-distended. Incision sites clean and dry.  ?Skin: Warm and well perfused. ?Neuro: AAOx3. No focal deficits on exam.  ?Psych: Normal mood and affect. ? ?Studies/Results: ?DG Cholangiogram Operative ? ?Result Date: 03/03/2022 ?CLINICAL DATA:  71 year old female with history of cholelithiasis. EXAM: INTRAOPERATIVE CHOLANGIOGRAM TECHNIQUE: Cholangiographic images from the C-arm fluoroscopic device were submitted for interpretation post-operatively. Please see the procedural report for the amount of contrast and the fluoroscopy time utilized. COMPARISON:  CT abdomen pelvis from 03/02/2022 FINDINGS: Intraoperative antegrade injection via the cystic duct which opacifies the common bile duct and central portions of  the intrahepatic biliary tree. Contrast is visualized flowing freely into the duodenum. There are no filling defects. Similar appearing fusiform moderate extrahepatic and central intrahepatic biliary ductal dilation. No apparent anomalous anatomical configuration of the biliary tree. IMPRESSION: Similar appearing moderate dilation of the common bile duct without evidence of choledocholithiasis. Marliss Coots, MD [facility] Electronically Signed   By: Marliss Coots M.D.   On: 03/03/2022 14:33   ? ?Anti-infectives: ?Anti-infectives (From admission, onward)  ? ? Start     Dose/Rate Route Frequency Ordered Stop  ? 03/03/22 0600  cefoTEtan (CEFOTAN) 2 g in sodium chloride 0.9 % 100 mL IVPB       ? 2 g ?200 mL/hr over 30 Minutes Intravenous On call to O.R. 03/03/22 0053 03/03/22 1308  ? 03/02/22 0400  piperacillin-tazobactam (ZOSYN) IVPB 3.375 g  Status:  Discontinued       ? 3.375 g ?12.5 mL/hr over 240 Minutes Intravenous Every 8 hours 03/02/22 0347 03/03/22 1529  ? ?  ? ? ?Assessment/Plan: ?s/p Procedure(s): ?LAPAROSCOPIC CHOLECYSTECTOMY WITH INTRAOPERATIVE CHOLANGIOGRAM ? ?Gabriella Pollard is a 71 y.o. female who is POD #1 from laparoscopic cholecystectomy with intraoperative cholangiogram. ? ?Neuro ?- tylenol q6h prn for pain ?- oxycodone 5mg  PO q4h prn for moderate pain ?- morphine 2mg  IV  q2h prn for severe pain ?Pulmonary ?- albuterol q4h PRN ?- wean oxygen ?Cardiac ?- amlodipine 5mg  PO daily ?GI ?- Zofran 4mg  q6h prn for nausea ?Prophylaxis ?- SCDs ?- heparin 500u  q8h ?Disposition ?- Discharge to home today. Instructed on importance of frequent ambulation and avoidance of prolonged immobility. ? ? LOS: 0 days  ? ? , Medical Student ?03/04/2022 ? ?

## 2022-03-04 NOTE — Anesthesia Postprocedure Evaluation (Signed)
Anesthesia Post Note ? ?Patient: Gabriella Pollard ? ?Procedure(s) Performed: LAPAROSCOPIC CHOLECYSTECTOMY WITH INTRAOPERATIVE CHOLANGIOGRAM (Abdomen) ? ?Patient location during evaluation: Phase II ?Anesthesia Type: General ?Level of consciousness: awake ?Pain management: pain level controlled ?Vital Signs Assessment: post-procedure vital signs reviewed and stable ?Respiratory status: spontaneous breathing and respiratory function stable ?Cardiovascular status: blood pressure returned to baseline and stable ?Postop Assessment: no headache and no apparent nausea or vomiting ?Anesthetic complications: no ?Comments: Late entry ? ? ?No notable events documented. ? ? ?Last Vitals:  ?Vitals:  ? 03/04/22 0109 03/04/22 0522  ?BP: (!) 121/51 140/66  ?Pulse: 65 73  ?Resp: 17 18  ?Temp: 37.2 ?C 37.2 ?C  ?SpO2: 98% 94%  ?  ?Last Pain:  ?Vitals:  ? 03/04/22 0630  ?TempSrc:   ?PainSc: Asleep  ? ? ?  ?  ?  ?  ?  ?  ? ?Windell Norfolk ? ? ? ? ?

## 2022-03-04 NOTE — Assessment & Plan Note (Signed)
Continue PPI ?

## 2022-03-05 ENCOUNTER — Encounter (HOSPITAL_COMMUNITY): Payer: Self-pay | Admitting: General Surgery

## 2022-03-05 LAB — SURGICAL PATHOLOGY

## 2023-05-12 IMAGING — CT CT ABD-PELV W/ CM
2 of 5 series · 16 of 46 positions shown, 18 images · IV contrast (Omnipaque or Isovue)
Comparison: None.

CLINICAL DATA: Acute abdominal pain, history of gallbladder disease

EXAM:
CT ABDOMEN AND PELVIS WITH CONTRAST
TECHNIQUE: Multidetector CT imaging of the abdomen and pelvis was performed
using the standard protocol following bolus administration of
intravenous contrast.

[Series 2: axial st · axial · 0.91mm/px · z∈[+904,+1329]mm · 13 of 95 slices shown, 15 images]
[im 5/95  soft-tissue]
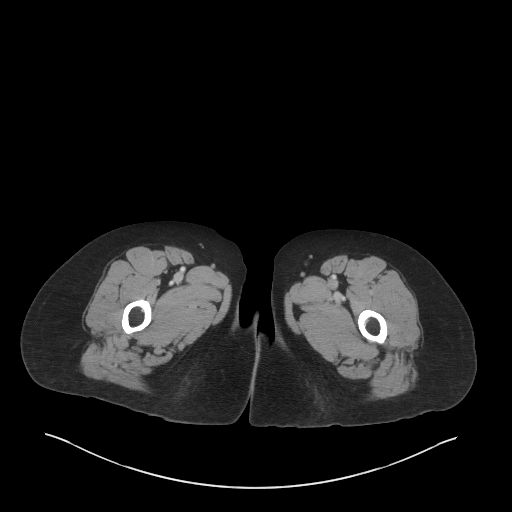
[im 5/95  bone]
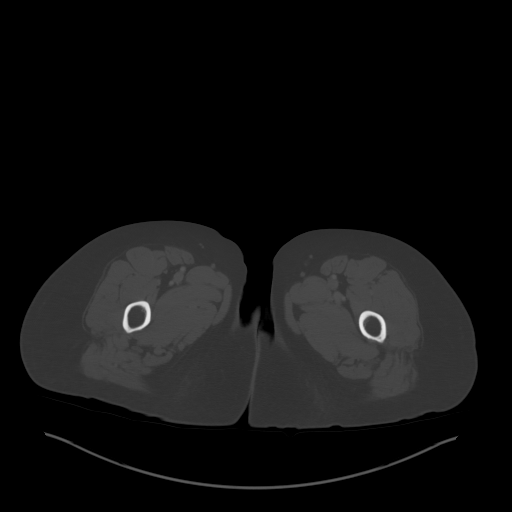
[im 15/95  soft-tissue]
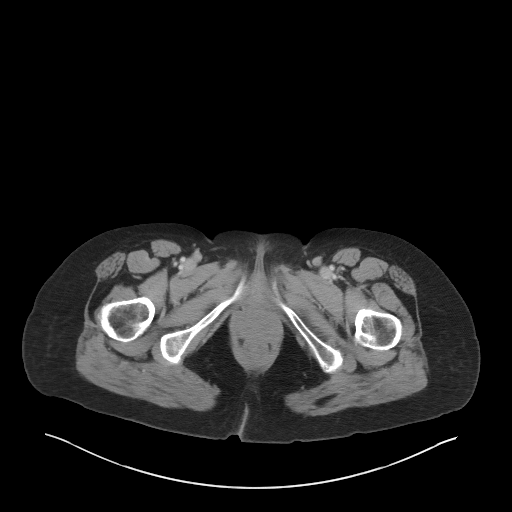
[im 19/95  soft-tissue]
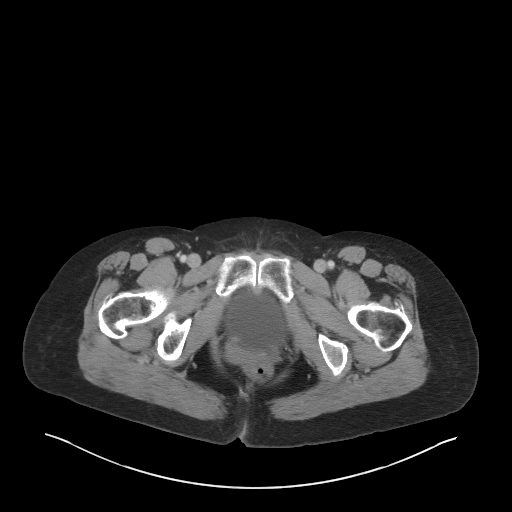
[im 29/95  soft-tissue]
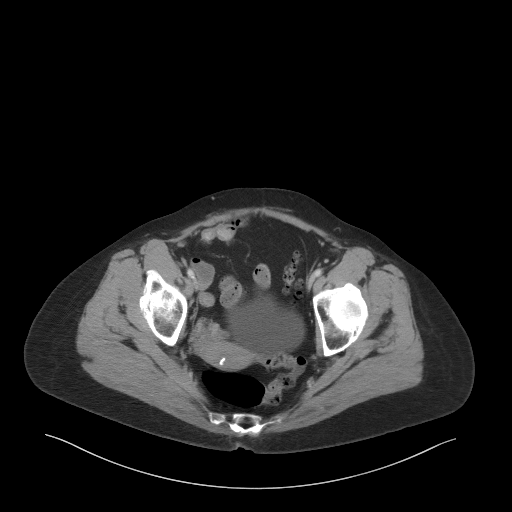
[im 33/95  soft-tissue]
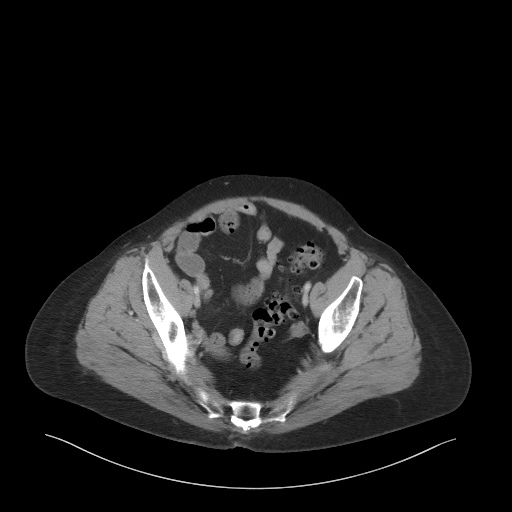
[im 43/95  soft-tissue]
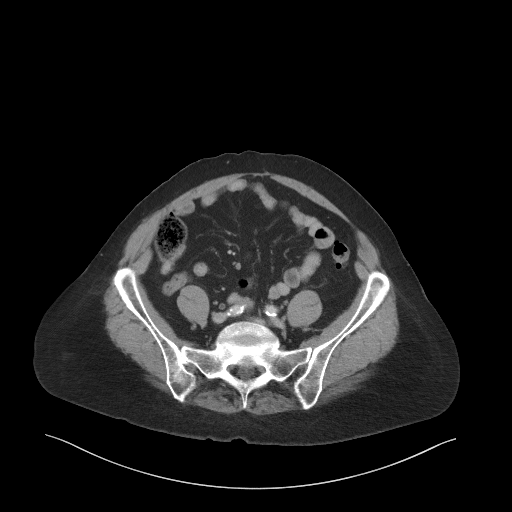
[im 48/95  soft-tissue]
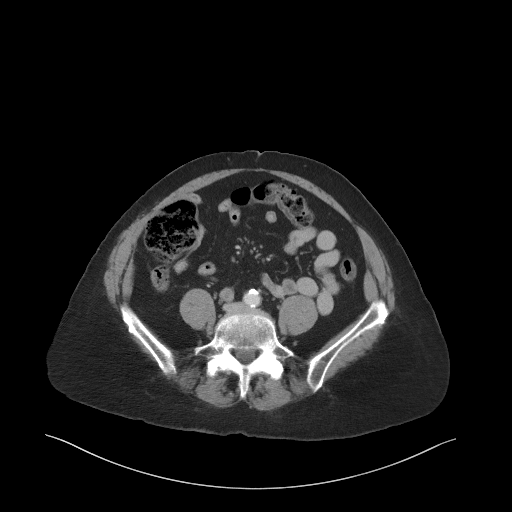
[im 52/95  soft-tissue]
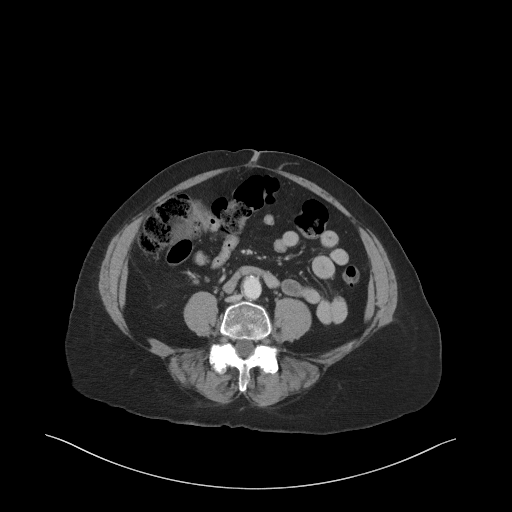
[im 62/95  soft-tissue]
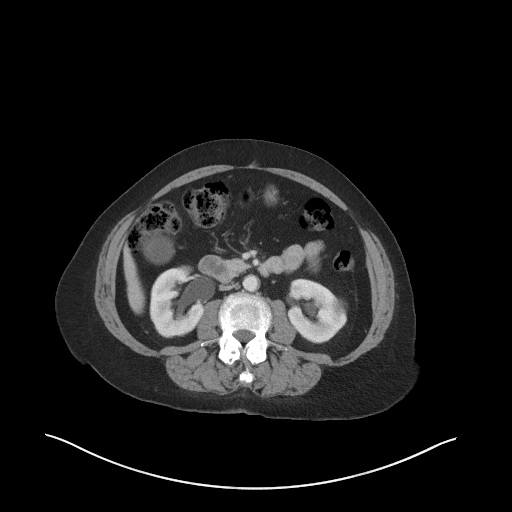
[im 62/95  bone]
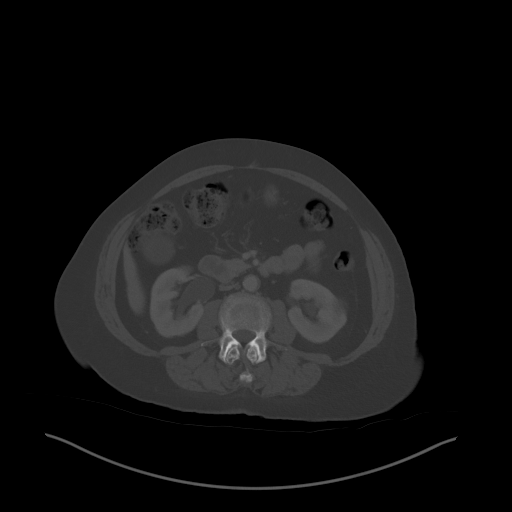
[im 66/95  soft-tissue]
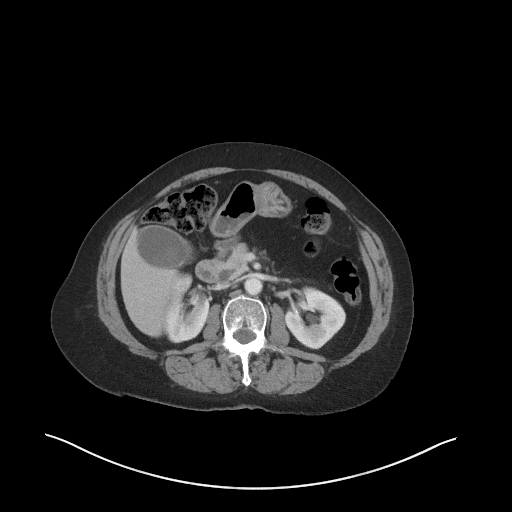
[im 76/95  soft-tissue]
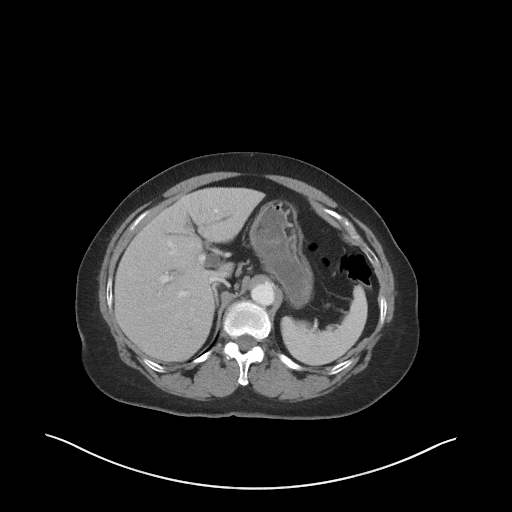
[im 80/95  soft-tissue]
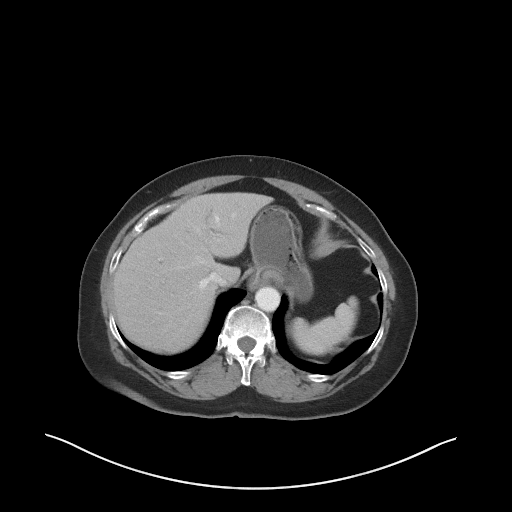
[im 90/95  soft-tissue]
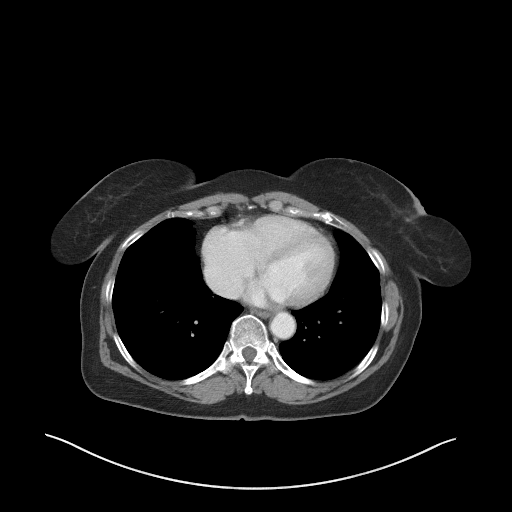

[Series 5: coronal st · coronal · 0.82mm/px · 3 of 104 slices shown]
[im 35/104  soft-tissue]
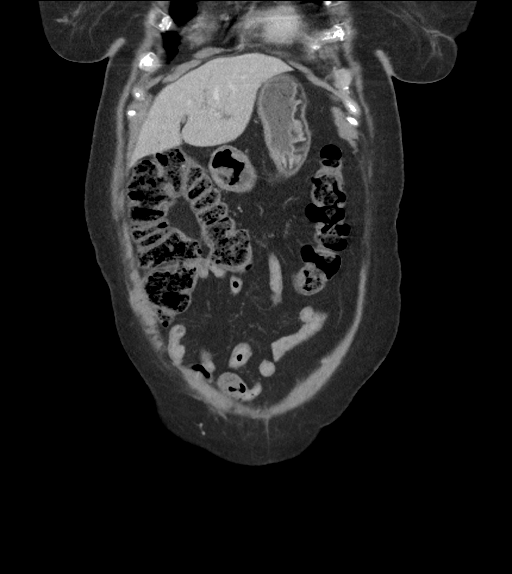
[im 46/104  soft-tissue]
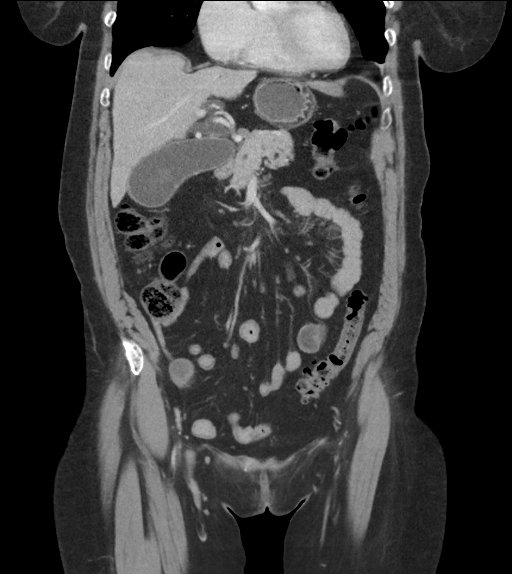
[im 58/104  soft-tissue]
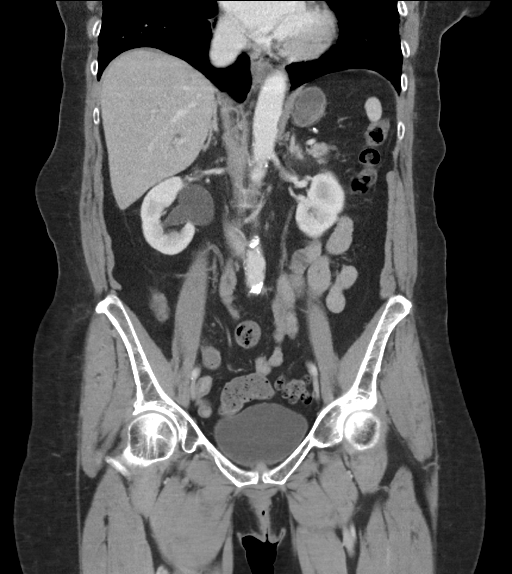

[16 of 46 positions shown; findings below may reference images not displayed]

RADIATION DOSE REDUCTION: This exam was performed according to the
departmental dose-optimization program which includes automated
exposure control, adjustment of the mA and/or kV according to
patient size and/or use of iterative reconstruction technique.

CONTRAST:  100mL OMNIPAQUE IOHEXOL 300 MG/ML  SOLN
FINDINGS: Lower chest: No acute abnormality.

Hepatobiliary: Liver is within normal limits. Gallbladder is well
distended with dependent gallstones. Distal common bile duct is
dilated to 11 mm. Mild intrahepatic ductal dilatation is seen. No
definitive stone is noted in the distal common bile duct.

Pancreas: Unremarkable. No pancreatic ductal dilatation or
surrounding inflammatory changes.

Spleen: Normal in size without focal abnormality.

Adrenals/Urinary Tract: Adrenal glands are within normal limits.
Kidneys demonstrate a normal enhancement pattern bilaterally. No
renal calculi are noted. Simple cyst is noted in the midportion of
the left kidney. Bladder is well distended. No obstructive changes
are seen. Prominent extrarenal pelvis is noted on the right.

Stomach/Bowel: Scattered diverticular change of the colon is noted
without evidence of diverticulitis. The appendix has been surgically
removed. Small bowel and stomach are within normal limits with the
exception of a small hiatal hernia.

Vascular/Lymphatic: Aortic atherosclerosis. No enlarged abdominal or
pelvic lymph nodes.

Reproductive: Calcified uterine fibroid is noted. Uterus is
otherwise within normal limits. Adnexa are unremarkable.

Other: No abdominal wall hernia or abnormality. No abdominopelvic
ascites.

Musculoskeletal: No acute or significant osseous findings.
IMPRESSION: Dilated gallbladder with evidence of mild cholelithiasis. Prominence
of the common bile duct is noted to 11 mm although no definitive
bile duct stone is seen. Mild intrahepatic ductal dilatation noted
as well.

Diverticulosis without diverticulitis.

## 2023-05-13 IMAGING — RF DG CHOLANGIOGRAM OPERATIVE
1 series · 12 of 12 positions shown · non-contrast
Comparison: CT abdomen pelvis from 03/02/2022

CLINICAL DATA: 71-year-old female with history of cholelithiasis.

EXAM:
INTRAOPERATIVE CHOLANGIOGRAM
TECHNIQUE: Cholangiographic images from the C-arm fluoroscopic device were
submitted for interpretation post-operatively. Please see the
procedural report for the amount of contrast and the fluoroscopy
time utilized.

[Series 1: run · 3 acquisitions, 12 frames shown]
[im 1/3]
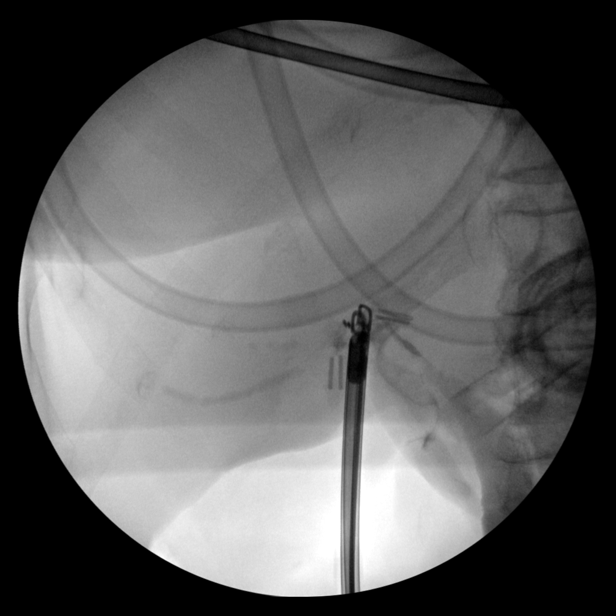
[im 1/3]
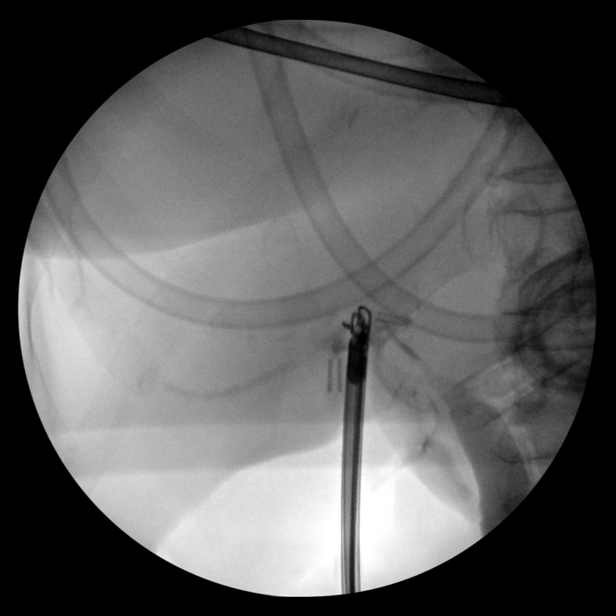
[im 1/3]
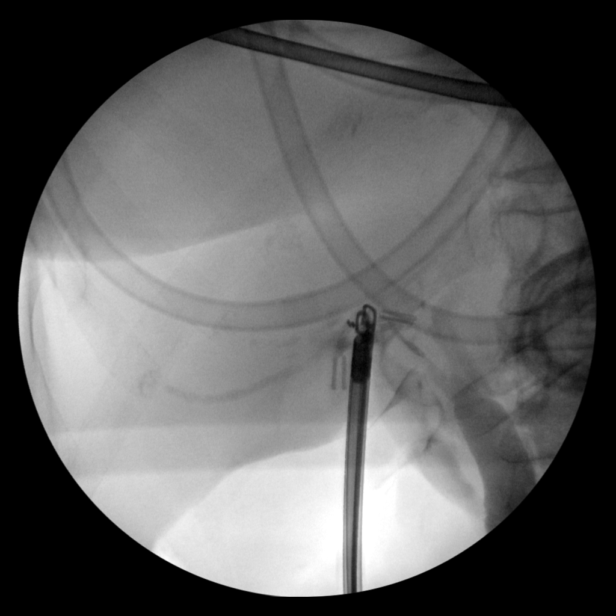
[im 1/3]
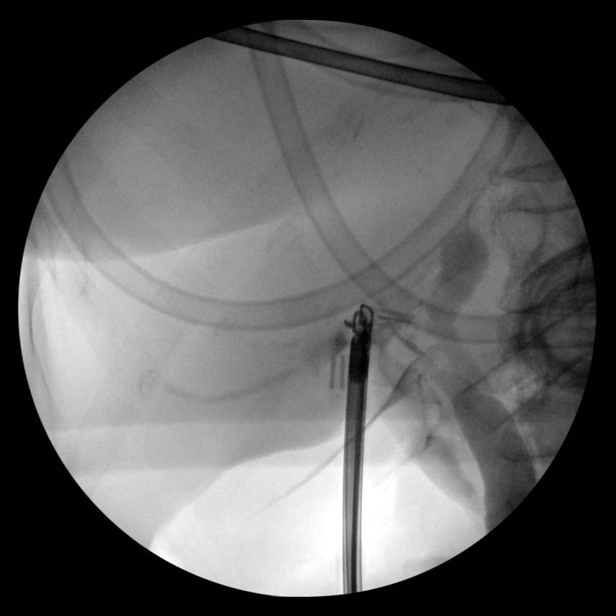
[im 2/3]
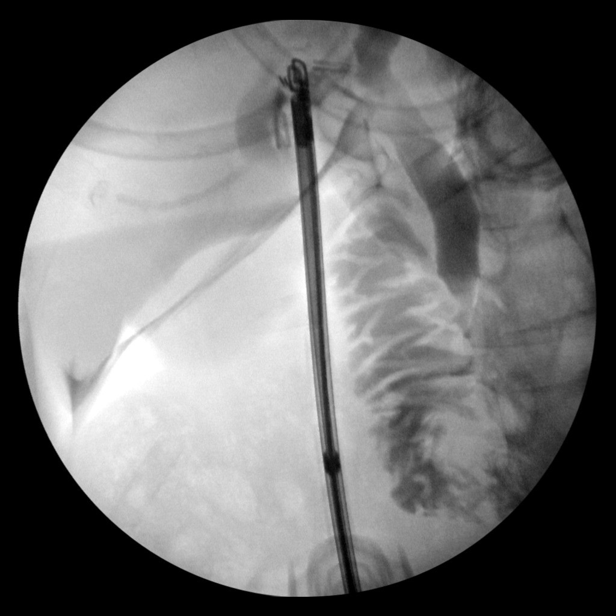
[im 2/3]
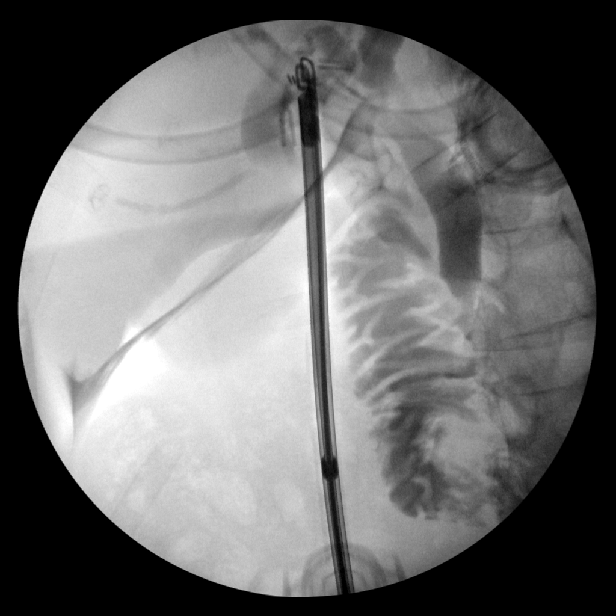
[im 2/3]
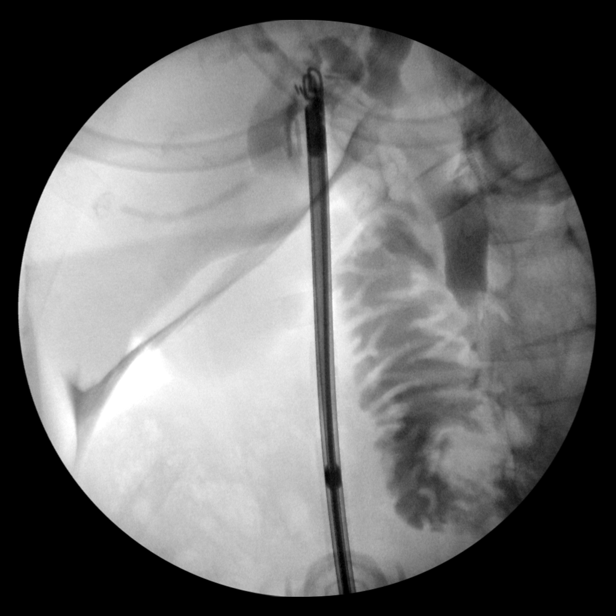
[im 2/3]
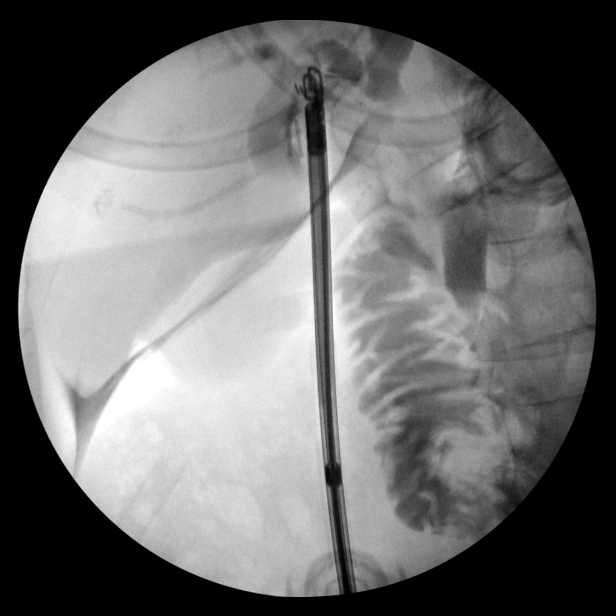
[im 3/3]
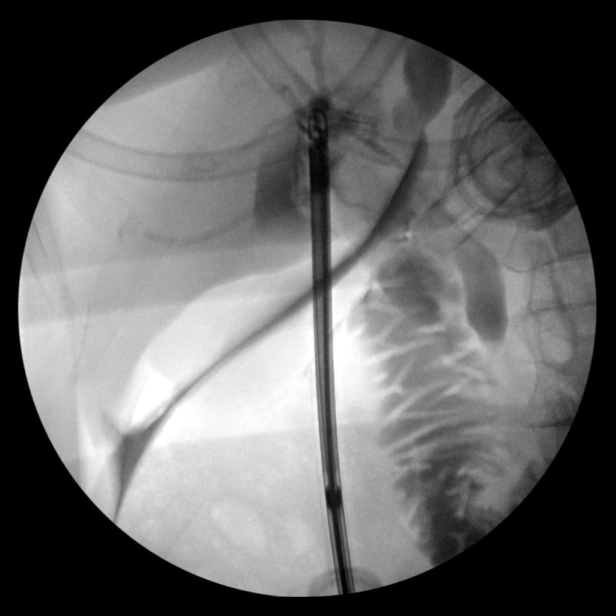
[im 3/3]
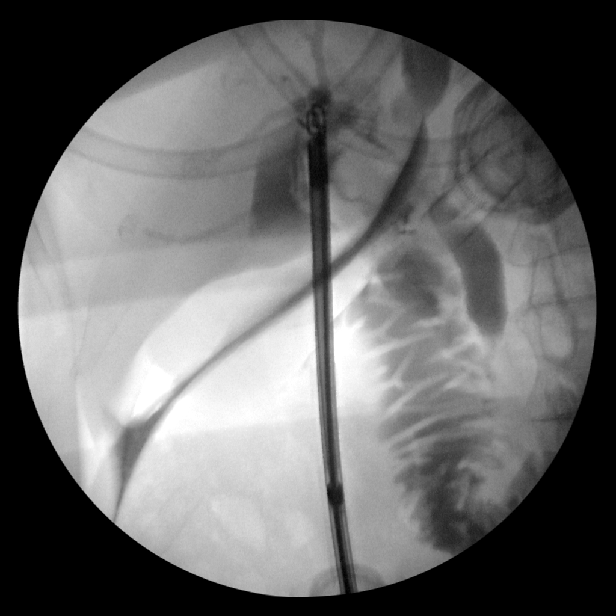
[im 3/3]
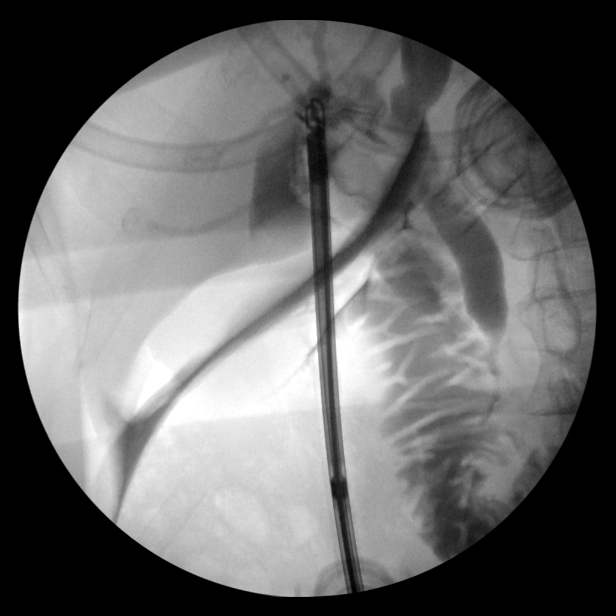
[im 3/3]
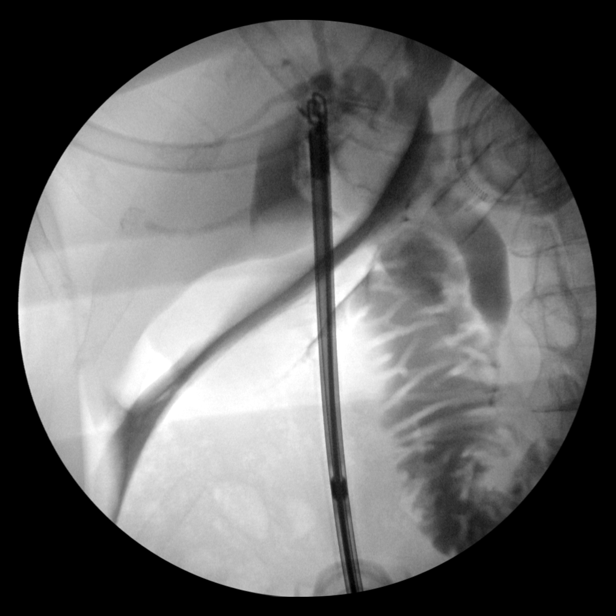

[12 of 12 positions shown; findings below may reference images not displayed]

FINDINGS: Intraoperative antegrade injection via the cystic duct which
opacifies the common bile duct and central portions of the
intrahepatic biliary tree. Contrast is visualized flowing freely
into the duodenum. There are no filling defects. Similar appearing
fusiform moderate extrahepatic and central intrahepatic biliary
ductal dilation. No apparent anomalous anatomical configuration of
the biliary tree.
IMPRESSION: Similar appearing moderate dilation of the common bile duct without
evidence of choledocholithiasis.
